# Patient Record
Sex: Female | Born: 1990 | Race: Black or African American | Hispanic: No | Marital: Single | Smoking: Never smoker
Health system: Southern US, Community
[De-identification: ages and names within clinical notes are randomized; demographics above are authoritative.]

## PROBLEM LIST (undated history)

## (undated) ENCOUNTER — Inpatient Hospital Stay (HOSPITAL_COMMUNITY): Payer: Self-pay

## (undated) DIAGNOSIS — E559 Vitamin D deficiency, unspecified: Secondary | ICD-10-CM

## (undated) DIAGNOSIS — K5792 Diverticulitis of intestine, part unspecified, without perforation or abscess without bleeding: Secondary | ICD-10-CM

## (undated) DIAGNOSIS — D509 Iron deficiency anemia, unspecified: Secondary | ICD-10-CM

---

## 2014-08-25 DIAGNOSIS — K5792 Diverticulitis of intestine, part unspecified, without perforation or abscess without bleeding: Secondary | ICD-10-CM

## 2014-08-25 HISTORY — DX: Diverticulitis of intestine, part unspecified, without perforation or abscess without bleeding: K57.92

## 2015-10-08 DIAGNOSIS — D509 Iron deficiency anemia, unspecified: Secondary | ICD-10-CM | POA: Insufficient documentation

## 2016-08-25 HISTORY — PX: DILATION AND CURETTAGE OF UTERUS: SHX78

## 2016-12-04 ENCOUNTER — Encounter: Payer: Self-pay | Admitting: Family Medicine

## 2016-12-18 ENCOUNTER — Inpatient Hospital Stay (HOSPITAL_COMMUNITY): Payer: Medicaid Other

## 2016-12-18 ENCOUNTER — Encounter (HOSPITAL_COMMUNITY): Payer: Self-pay | Admitting: *Deleted

## 2016-12-18 ENCOUNTER — Inpatient Hospital Stay (HOSPITAL_COMMUNITY)
Admission: AD | Admit: 2016-12-18 | Discharge: 2016-12-18 | Disposition: A | Discharge status: Home / Self Care | Payer: Medicaid Other | Source: Ambulatory Visit | Attending: Obstetrics & Gynecology | Admitting: Obstetrics & Gynecology

## 2016-12-18 DIAGNOSIS — O021 Missed abortion: Secondary | ICD-10-CM

## 2016-12-18 DIAGNOSIS — IMO0002 Reserved for concepts with insufficient information to code with codable children: Secondary | ICD-10-CM

## 2016-12-18 LAB — URINALYSIS, ROUTINE W REFLEX MICROSCOPIC
Bilirubin Urine: NEGATIVE
GLUCOSE, UA: NEGATIVE mg/dL
Hgb urine dipstick: NEGATIVE
Ketones, ur: NEGATIVE mg/dL
LEUKOCYTES UA: NEGATIVE
NITRITE: NEGATIVE
PH: 7 (ref 5.0–8.0)
PROTEIN: NEGATIVE mg/dL
Specific Gravity, Urine: 1.011 (ref 1.005–1.030)

## 2016-12-18 LAB — POCT PREGNANCY, URINE: Preg Test, Ur: POSITIVE — AB

## 2016-12-18 NOTE — Discharge Instructions (Signed)

## 2016-12-18 NOTE — MAU Note (Signed)
Pt was at [facility]. Told she was measuring 9 weeks(supposed to be 12 weeks) and possible having a miscarriage. Pt has no symptoms wants a second opinion. Denies pain, bleeding or discharge.

## 2016-12-18 NOTE — MAU Provider Note (Signed)
  History     CSN: [id number]  Arrival date and time: 12/18/16 1516   None     No chief complaint on file.  Patient is a 26 showed G1 P0 at 11 weeks based on a 6 week 6 day ultrasound performed in the [facility] emergency Department. She presents today for what appears to be a second opinion on if she is having a miscarriage or not. She was seen by her OB today at [facility] OB ultrasound was performed for nuchal translucency and they saw a baby that was lagging in growth with no heartbeat. She after having ultrasound here did reveal to me that she had Artie been scheduled for [facility] on Monday. She has had no abdominal pain or bleeding.    OB History    Gravida Para Term Preterm AB Living   1             SAB TAB Ectopic Multiple Live Births                  No past medical history on file.  No past surgical history on file.  No family history on file.  Social History  Substance Use Topics  . Smoking status: Not on file  . Smokeless tobacco: Not on file  . Alcohol use Not on file    Allergies: Allergies not on file  No prescriptions prior to admission.    Review of Systems  Constitutional: Negative for chills and fever.  HENT: Negative for congestion and rhinorrhea.   Respiratory: Negative for cough and shortness of breath.   Gastrointestinal: Negative for abdominal distention, abdominal pain, constipation, diarrhea, nausea and vomiting.  Genitourinary: Negative for difficulty urinating, dysuria and frequency.  Neurological: Negative for dizziness and weakness.   Physical Exam   Blood pressure 131/65, pulse 92, temperature 98.6 F (37 C), resp. rate 18, height  (1.626 m), weight 183 lb (83 kg), last menstrual period 09/25/2016.  Physical Exam  Constitutional: She is oriented to person, place, and time. She appears well-developed and well-nourished.  HENT:  Head: Normocephalic and atraumatic.  Cardiovascular: Normal rate and intact distal pulses.    Respiratory: Effort normal. No respiratory distress.  GI: Soft. She exhibits no distension. There is no tenderness. There is no rebound and no guarding.  Musculoskeletal: Normal range of motion. She exhibits no edema.  Neurological: She is alert and oriented to person, place, and time. No cranial nerve deficit.  Skin: Skin is warm and dry.  Psychiatric: She has a normal mood and affect. Her behavior is normal.    MAU Course  Procedures  MDM In MA U patient underwent evaluation with ultrasound. Ultrasound revealed a 9 week 0 day intrauterine pregnancy with no fetal heart tones. This meets definitive criteria for missed AB and should be managed as such.   Patient referred back to her primary OB for further care.  Assessment and Plan  #1: Missed AB: Patient is 9 weeks 0 days' could be offered Cytotec protocol versus surgical management. Given that we are not patient's primary OB she was referred back to her primary OB who had Artie scheduled surgical management. She is to explore nonsurgical management further and will call her obstetrician and asked about this.  Ernestina Penna 12/18/2016, 5:45 PM

## 2016-12-26 DIAGNOSIS — O034 Incomplete spontaneous abortion without complication: Secondary | ICD-10-CM | POA: Insufficient documentation

## 2017-07-23 DIAGNOSIS — K5792 Diverticulitis of intestine, part unspecified, without perforation or abscess without bleeding: Secondary | ICD-10-CM | POA: Insufficient documentation

## 2017-08-13 DIAGNOSIS — Z862 Personal history of diseases of the blood and blood-forming organs and certain disorders involving the immune mechanism: Secondary | ICD-10-CM | POA: Insufficient documentation

## 2017-08-25 NOTE — L&D Delivery Note (Signed)
Delivery Note At  a viable female was delivered via  (Presentation:vertex ;  LOA).  APGAR:8 , ; weight  .   Placenta status:spont ,shultz .  Cord:3vc  with the following complications:none .  Cord pH: n/a  Anesthesia:  epidural Episiotomy:  none Lacerations:   Suture Repair: 2.0 vicryl rapide Est. Blood Loss (mL):    Mom to postpartum.  Baby to Couplet care / Skin to Skin.  Wyvonnia DuskyMarie Corina Stacy 03/14/2018, 9:24 AM

## 2017-09-01 ENCOUNTER — Ambulatory Visit (INDEPENDENT_AMBULATORY_CARE_PROVIDER_SITE_OTHER): Payer: Medicaid Other | Admitting: Certified Nurse Midwife

## 2017-09-01 ENCOUNTER — Other Ambulatory Visit (HOSPITAL_COMMUNITY)
Admission: RE | Admit: 2017-09-01 | Discharge: 2017-09-01 | Disposition: A | Discharge status: Home / Self Care | Payer: Medicaid Other | Source: Ambulatory Visit | Attending: Certified Nurse Midwife | Admitting: Certified Nurse Midwife

## 2017-09-01 ENCOUNTER — Encounter: Payer: Self-pay | Admitting: Certified Nurse Midwife

## 2017-09-01 VITALS — Wt 183.4 lb

## 2017-09-01 DIAGNOSIS — Z3480 Encounter for supervision of other normal pregnancy, unspecified trimester: Secondary | ICD-10-CM

## 2017-09-01 DIAGNOSIS — Z3481 Encounter for supervision of other normal pregnancy, first trimester: Secondary | ICD-10-CM | POA: Insufficient documentation

## 2017-09-01 NOTE — Progress Notes (Signed)
Subjective:   Pamela Brandt is a 27 y.o. G2P0010 at [redacted]w[redacted]d by LMP, early ultrasound performed at 9 weeks in [place] being seen today for her first obstetrical visit.  Her obstetrical history is significant for hx of missed ab in April 2018. Patient does intend to breast feed. Pregnancy history fully reviewed.  Patient reports no complaints.  HISTORY: Obstetric History   G2   P0   T0   P0   A1   L0    SAB1   TAB0   Ectopic0   Multiple0   Live Births0     # Outcome Date GA Lbr Len/2nd Weight Sex Delivery Anes PTL Lv  2 Current           1 SAB 11/2016             History reviewed. No pertinent past medical history. Past Surgical History:  Procedure Laterality Date  . DILATION AND CURETTAGE OF UTERUS  2018   History reviewed. No pertinent family history. Social History   Tobacco Use  . Smoking status: Never Smoker  . Smokeless tobacco: Never Used  Substance Use Topics  . Alcohol use: No    Frequency: Never  . Drug use: No   Allergies  Allergen Reactions  . Latex Rash and Dermatitis   Current Outpatient Medications on File Prior to Visit  Medication Sig Dispense Refill  . Prenat w/o A-FeCbGl-DSS-FA-DHA (CITRANATAL 90 DHA) 90-1 & 300 MG MISC 1 of each tablet daily    . Prenatal Vit-Fe Fumarate-FA (PRENATAL MULTIVITAMIN) TABS tablet Take 1 tablet by mouth daily at 12 noon.     No current facility-administered medications on file prior to visit.      Exam   Vitals:   09/01/17 1017  Weight: 183 lb 6.4 oz (83.2 kg)   Fetal Heart Rate (bpm): 180; doppler  Uterus:     Pelvic Exam: Perineum: no hemorrhoids, normal perineum   Vulva: normal external genitalia, no lesions   Vagina:  normal mucosa, normal discharge   Cervix: no lesions and normal, pap smear done.    Adnexa: normal adnexa and no mass, fullness, tenderness   Bony Pelvis: average  System: General: well-developed, well-nourished female in no acute distress   Breast:  normal appearance, no masses or  tenderness   Skin: normal coloration and turgor, no rashes   Neurologic: oriented, normal, negative, normal mood   Extremities: normal strength, tone, and muscle mass, ROM of all joints is normal   HEENT PERRLA, extraocular movement intact and sclera clear, anicteric   Mouth/Teeth mucous membranes moist, pharynx normal without lesions and dental hygiene good   Neck supple and no masses   Cardiovascular: regular rate and rhythm   Respiratory:  no respiratory distress, normal breath sounds   Abdomen: soft, non-tender; bowel sounds normal; no masses,  no organomegaly     Assessment:   Pregnancy: G2P0010 Patient Active Problem List   Diagnosis Date Noted  . Supervision of other normal pregnancy, antepartum 09/01/2017  . Hx of iron deficiency anemia 08/13/2017  . Diverticulitis 07/23/2017  . Incomplete abortion 12/26/2016  . Microcytic anemia 10/08/2015     Plan:  1. Supervision of other normal pregnancy, antepartum      - Inheritest Core(CF97,SMA,FraX) - MaterniT21 PLUS Core+ESS+SCA - Culture, OB Urine - Obstetric Panel, Including HIV - Hemoglobinopathy evaluation - Cervicovaginal ancillary only - Cytology - PAP - Hemoglobin A1c - [place] MFM OB COMP + 14 WK; Future - VITAMIN D 25  Hydroxy (Vit-D Deficiency, Fractures)   Initial labs drawn. Continue prenatal vitamins. Genetic Screening discussed, NIPS: ordered. Ultrasound discussed; fetal anatomic survey: ordered. Problem list reviewed and updated. The nature of [facility] with multiple MDs and other [facility] Providers was explained to patient; also emphasized that residents, students are part of our team. Routine obstetric precautions reviewed. Return in about 4 weeks (around 09/29/2017) for ROB.     Orvilla Cornwallachelle Falan Hensler, CNM [facility]

## 2017-09-01 NOTE — Patient Instructions (Addendum)
Second Trimester of Pregnancy The second trimester is from week 13 through week 28, month 4 through 6. This is often the time in pregnancy that you feel your best. Often times, morning sickness has lessened or quit. You may have more energy, and you may get hungry more often. Your unborn baby (fetus) is growing rapidly. At the end of the sixth month, he or she is about 9 inches long and weighs about 1 pounds. You will likely feel the baby move (quickening) between 18 and 20 weeks of pregnancy. Follow these instructions at home:  Avoid all smoking, herbs, and alcohol. Avoid drugs not approved by your doctor.  Do not use any tobacco products, including cigarettes, chewing tobacco, and electronic cigarettes. If you need help quitting, ask your doctor. You may get counseling or other support to help you quit.  Only take medicine as told by your doctor. Some medicines are safe and some are not during pregnancy.  Exercise only as told by your doctor. Stop exercising if you start having cramps.  Eat regular, healthy meals.  Wear a good support bra if your breasts are tender.  Do not use hot tubs, steam rooms, or saunas.  Wear your seat belt when driving.  Avoid raw meat, uncooked cheese, and liter boxes and soil used by cats.  Take your prenatal vitamins.  Take 1500-2000 milligrams of calcium daily starting at the 20th week of pregnancy until you deliver your baby.  Try taking medicine that helps you poop (stool softener) as needed, and if your doctor approves. Eat more fiber by eating fresh fruit, vegetables, and whole grains. Drink enough fluids to keep your pee (urine) clear or pale yellow.  Take warm water baths (sitz baths) to soothe pain or discomfort caused by hemorrhoids. Use hemorrhoid cream if your doctor approves.  If you have puffy, bulging veins (varicose veins), wear support hose. Raise (elevate) your feet for 15 minutes, 3-4 times a day. Limit salt in your diet.  Avoid heavy  lifting, wear low heals, and sit up straight.  Rest with your legs raised if you have leg cramps or low back pain.  Visit your dentist if you have not gone during your pregnancy. Use a soft toothbrush to brush your teeth. Be gentle when you floss.  You can have sex (intercourse) unless your doctor tells you not to.  Go to your doctor visits. Get help if:  You feel dizzy.  You have mild cramps or pressure in your lower belly (abdomen).  You have a nagging pain in your belly area.  You continue to feel sick to your stomach (nauseous), throw up (vomit), or have watery poop (diarrhea).  You have bad smelling fluid coming from your vagina.  You have pain with peeing (urination). Get help right away if:  You have a fever.  You are leaking fluid from your vagina.  You have spotting or bleeding from your vagina.  You have severe belly cramping or pain.  You lose or gain weight rapidly.  You have trouble catching your breath and have chest pain.  You notice sudden or extreme puffiness (swelling) of your face, hands, ankles, feet, or legs.  You have not felt the baby move in over an hour.  You have severe headaches that do not go away with medicine.  You have vision changes. This information is not intended to replace advice given to you by your health care provider. Make sure you discuss any questions you have with your health care   provider. Document Released: 11/05/2009 Document Revised: 01/17/2016 Document Reviewed: 10/12/2012 Elsevier Interactive Patient Education  2017 [employer]  Prenatal Care WHAT IS PRENATAL CARE? Prenatal care is the process of caring for a pregnant woman before she gives birth. Prenatal care makes sure that she and her baby remain as healthy as possible throughout pregnancy. Prenatal care may be provided by a midwife, family practice health care provider, or a childbirth and pregnancy specialist (obstetrician). Prenatal care may include physical  examinations, testing, treatments, and education on nutrition, lifestyle, and social support services. WHY IS PRENATAL CARE SO IMPORTANT? Early and consistent prenatal care increases the chance that you and your baby will remain healthy throughout your pregnancy. This type of care also decreases a baby's risk of being born too early (prematurely), or being born smaller than expected (small for gestational age). Any underlying medical conditions you may have that could pose a risk during your pregnancy are discussed during prenatal care visits. You will also be monitored regularly for any new conditions that may arise during your pregnancy so they can be treated quickly and effectively. WHAT HAPPENS DURING PRENATAL CARE VISITS? Prenatal care visits may include the following: Discussion Tell your health care provider about any new signs or symptoms you have experienced since your last visit. These might include:  Nausea or vomiting.  Increased or decreased level of energy.  Difficulty sleeping.  Back or leg pain.  Weight changes.  Frequent urination.  Shortness of breath with physical activity.  Changes in your skin, such as the development of a rash or itchiness.  Vaginal discharge or bleeding.  Feelings of excitement or nervousness.  Changes in your baby's movements.  You may want to write down any questions or topics you want to discuss with your health care provider and bring them with you to your appointment. Examination During your first prenatal care visit, you will likely have a complete physical exam. Your health care provider will often examine your vagina, cervix, and the position of your uterus, as well as check your heart, lungs, and other body systems. As your pregnancy progresses, your health care provider will measure the size of your uterus and your baby's position inside your uterus. He or she may also examine you for early signs of labor. Your prenatal visits may also  include checking your blood pressure and, after about 10-12 weeks of pregnancy, listening to your baby's heartbeat. Testing Regular testing often includes:  Urinalysis. This checks your urine for glucose, protein, or signs of infection.  Blood count. This checks the levels of white and red blood cells in your body.  Tests for sexually transmitted infections (STIs). Testing for STIs at the beginning of pregnancy is routinely done and is required in many states.  Antibody testing. You will be checked to see if you are immune to certain illnesses, such as rubella, that can affect a developing fetus.  Glucose screen. Around 24-28 weeks of pregnancy, your blood glucose level will be checked for signs of gestational diabetes. Follow-up tests may be recommended.  Group B strep. This is a bacteria that is commonly found inside a woman's vagina. This test will inform your health care provider if you need an antibiotic to reduce the amount of this bacteria in your body prior to labor and childbirth.  Ultrasound. Many pregnant women undergo an ultrasound screening around 18-20 weeks of pregnancy to evaluate the health of the fetus and check for any developmental abnormalities.  HIV (human immunodeficiency virus) testing. Early  in your pregnancy, you will be screened for HIV. If you are at high risk for HIV, this test may be repeated during your third trimester of pregnancy.  You may be offered other testing based on your age, personal or family medical history, or other factors. HOW OFTEN SHOULD I PLAN TO SEE MY HEALTH CARE PROVIDER FOR PRENATAL CARE? Your prenatal care check-up schedule depends on any medical conditions you have before, or develop during, your pregnancy. If you do not have any underlying medical conditions, you will likely be seen for checkups:  Monthly, during the first 6 months of pregnancy.  Twice a month during months 7 and 8 of pregnancy.  Weekly starting in the 9th month of  pregnancy and until delivery.  If you develop signs of early labor or other concerning signs or symptoms, you may need to see your health care provider more often. Ask your health care provider what prenatal care schedule is best for you. WHAT CAN I DO TO KEEP MYSELF AND MY BABY AS HEALTHY AS POSSIBLE DURING MY PREGNANCY?  Take a prenatal vitamin containing 400 micrograms (0.4 mg) of folic acid every day. Your health care provider may also ask you to take additional vitamins such as iodine, vitamin D, iron, copper, and zinc.  Take 1500-2000 mg of calcium daily starting at your 20th week of pregnancy until you deliver your baby.  Make sure you are up to date on your vaccinations. Unless directed otherwise by your health care provider: ? You should receive a tetanus, diphtheria, and pertussis (Tdap) vaccination between the 27th and 36th week of your pregnancy, regardless of when your last Tdap immunization occurred. This helps protect your baby from whooping cough (pertussis) after he or she is born. ? You should receive an annual inactivated influenza vaccine (IIV) to help protect you and your baby from influenza. This can be done at any point during your pregnancy.  Eat a well-rounded diet that includes: ? Fresh fruits and vegetables. ? Lean proteins. ? Calcium-rich foods such as milk, yogurt, hard cheeses, and dark, leafy greens. ? Whole grain breads.  Do noteat seafood high in mercury, including: ? Swordfish. ? Tilefish. ? Shark. ? King mackerel. ? More than 6 oz tuna per week.  Do not eat: ? Raw or undercooked meats or eggs. ? Unpasteurized foods, such as soft cheeses (brie, blue, or feta), juices, and milks. ? Lunch meats. ? Hot dogs that have not been heated until they are steaming.  Drink enough water to keep your urine clear or pale yellow. For many women, this may be 10 or more 8 oz glasses of water each day. Keeping yourself hydrated helps deliver nutrients to your baby and  may prevent the start of pre-term uterine contractions.  Do not use any tobacco products including cigarettes, chewing tobacco, or electronic cigarettes. If you need help quitting, ask your health care provider.  Do not drink beverages containing alcohol. No safe level of alcohol consumption during pregnancy has been determined.  Do not use any illegal drugs. These can harm your developing baby or cause a miscarriage.  Ask your health care provider or pharmacist before taking any prescription or over-the-counter medicines, herbs, or supplements.  Limit your caffeine intake to no more than 200 mg per day.  Exercise. Unless told otherwise by your health care provider, try to get 30 minutes of moderate exercise most days of the week. Do not  do high-impact activities, contact sports, or activities with a high risk  of falling, such as horseback riding or downhill skiing.  Get plenty of rest.  Avoid anything that raises your body temperature, such as hot tubs and saunas.  If you own a cat, do not empty its litter box. Bacteria contained in cat feces can cause an infection called toxoplasmosis. This can result in serious harm to the fetus.  Stay away from chemicals such as insecticides, lead, mercury, and cleaning or paint products that contain solvents.  Do not have any X-rays taken unless medically necessary.  Take a childbirth and breastfeeding preparation class. Ask your health care provider if you need a referral or recommendation.  This information is not intended to replace advice given to you by your health care provider. Make sure you discuss any questions you have with your health care provider. Document Released: 08/14/2003 Document Revised: 01/14/2016 Document Reviewed: 10/26/2013 Elsevier Interactive Patient Education  2017 [employer].  How a Baby Grows During Pregnancy Pregnancy begins when a female's sperm enters a female's egg (fertilization). This happens in one of the  tubes (fallopian tubes) that connect the ovaries to the womb (uterus). The fertilized egg is called an embryo until it reaches 10 weeks. From 10 weeks until birth, it is called a fetus. The fertilized egg moves down the fallopian tube to the uterus. Then it implants into the lining of the uterus and begins to grow. The developing fetus receives oxygen and nutrients through the pregnant woman's bloodstream and the tissues that grow (placenta) to support the fetus. The placenta is the life support system for the fetus. It provides nutrition and removes waste. Learning as much as you can about your pregnancy and how your baby is developing can help you enjoy the experience. It can also make you aware of when there might be a problem and when to ask questions. How long does a typical pregnancy last? A pregnancy usually lasts 280 days, or about 40 weeks. Pregnancy is divided into three trimesters:  First trimester: 0-13 weeks.  Second trimester: 14-27 weeks.  Third trimester: 28-40 weeks.  The day when your baby is considered ready to be born (full term) is your estimated date of delivery. How does my baby develop month by month? First month  The fertilized egg attaches to the inside of the uterus.  Some cells will form the placenta. Others will form the fetus.  The arms, legs, brain, spinal cord, lungs, and heart begin to develop.  At the end of the first month, the heart begins to beat.  Second month  The bones, inner ear, eyelids, hands, and feet form.  The genitals develop.  By the end of 8 weeks, all major organs are developing.  Third month  All of the internal organs are forming.  Teeth develop below the gums.  Bones and muscles begin to grow. The spine can flex.  The skin is transparent.  Fingernails and toenails begin to form.  Arms and legs continue to grow longer, and hands and feet develop.  The fetus is about 3 in (7.6 cm) long.  Fourth month  The placenta is  completely formed.  The external sex organs, neck, outer ear, eyebrows, eyelids, and fingernails are formed.  The fetus can hear, swallow, and move its arms and legs.  The kidneys begin to produce urine.  The skin is covered with a white waxy coating (vernix) and very fine hair (lanugo).  Fifth month  The fetus moves around more and can be felt for the first time (quickening).  The fetus starts to sleep and wake up and may begin to suck its finger.  The nails grow to the end of the fingers.  The organ in the digestive system that makes bile (gallbladder) functions and helps to digest the nutrients.  If your baby is a girl, eggs are present in her ovaries. If your baby is a boy, testicles start to move down into his scrotum.  Sixth month  The lungs are formed, but the fetus is not yet able to breathe.  The eyes open. The brain continues to develop.  Your baby has fingerprints and toe prints. Your baby's hair grows thicker.  At the end of the second trimester, the fetus is about 9 in (22.9 cm) long.  Seventh month  The fetus kicks and stretches.  The eyes are developed enough to sense changes in light.  The hands can make a grasping motion.  The fetus responds to sound.  Eighth month  All organs and body systems are fully developed and functioning.  Bones harden and taste buds develop. The fetus may hiccup.  Certain areas of the brain are still developing. The skull remains soft.  Ninth month  The fetus gains about  lb (0.23 kg) each week.  The lungs are fully developed.  Patterns of sleep develop.  The fetus's head typically moves into a head-down position (vertex) in the uterus to prepare for birth. If the buttocks move into a vertex position instead, the baby is breech.  The fetus weighs 6-9 lbs (2.72-4.08 kg) and is 19-20 in (48.26-50.8 cm) long.  What can I do to have a healthy pregnancy and help my baby develop? Eating and Drinking  Eat a healthy  diet. ? Talk with your health care provider to make sure that you are getting the nutrients that you and your baby need. ? Visit www.[employer].be to learn about creating a healthy diet.  Gain a healthy amount of weight during pregnancy as advised by your health care provider. This is usually 25-35 pounds. You may need to: ? Gain more if you were underweight before getting pregnant or if you are pregnant with more than one baby. ? Gain less if you were overweight or obese when you got pregnant.  Medicines and Vitamins  Take prenatal vitamins as directed by your health care provider. These include vitamins such as folic acid, iron, calcium, and vitamin D. They are important for healthy development.  Take medicines only as directed by your health care provider. Read labels and ask a pharmacist or your health care provider whether over-the-counter medicines, supplements, and prescription drugs are safe to take during pregnancy.  Activities  Be physically active as advised by your health care provider. Ask your health care provider to recommend activities that are safe for you to do, such as walking or swimming.  Do not participate in strenuous or extreme sports.  Lifestyle  Do not drink alcohol.  Do not use any tobacco products, including cigarettes, chewing tobacco, or electronic cigarettes. If you need help quitting, ask your health care provider.  Do not use illegal drugs.  Safety  Avoid exposure to mercury, lead, or other heavy metals. Ask your health care provider about common sources of these heavy metals.  Avoid listeria infection during pregnancy. Follow these precautions: ? Do not eat soft cheeses or deli meats. ? Do not eat hot dogs unless they have been warmed up to the point of steaming, such as in the microwave oven. ? Do not drink unpasteurized  milk.  Avoid toxoplasmosis infection during pregnancy. Follow these precautions: ? Do not change your cat's litter box,  if you have a cat. Ask someone else to do this for you. ? Wear gardening gloves while working in the yard.  General Instructions  Keep all follow-up visits as directed by your health care provider. This is important. This includes prenatal care and screening tests.  Manage any chronic health conditions. Work closely with your health care provider to keep conditions, such as diabetes, under control.  How do I know if my baby is developing well? At each prenatal visit, your health care provider will do several different tests to check on your health and keep track of your baby's development. These include:  Fundal height. ? Your health care provider will measure your growing belly from top to bottom using a tape measure. ? Your health care provider will also feel your belly to determine your baby's position.  Heartbeat. ? An ultrasound in the first trimester can confirm pregnancy and show a heartbeat, depending on how far along you are. ? Your health care provider will check your baby's heart rate at every prenatal visit. ? As you get closer to your delivery date, you may have regular fetal heart rate monitoring to make sure that your baby is not in distress.  Second trimester ultrasound. ? This ultrasound checks your baby's development. It also indicates your baby's gender.  What should I do if I have concerns about my baby's development? Always talk with your health care provider about any concerns that you may have. This information is not intended to replace advice given to you by your health care provider. Make sure you discuss any questions you have with your health care provider. Document Released: 01/28/2008 Document Revised: 01/17/2016 Document Reviewed: 01/18/2014 Elsevier Interactive Patient Education  [employer].

## 2017-09-01 NOTE — Progress Notes (Signed)
Patient is in the office for initial ob visit. This is a planned pregnancy.

## 2017-09-01 NOTE — Addendum Note (Signed)
Addended by: Natale MilchSTALLING, Alaia Lordi D on: 09/01/2017 03:30 PM   Modules accepted: Orders

## 2017-09-02 LAB — CERVICOVAGINAL ANCILLARY ONLY
BACTERIAL VAGINITIS: POSITIVE — AB
CANDIDA VAGINITIS: NEGATIVE
CHLAMYDIA, DNA PROBE: NEGATIVE
Neisseria Gonorrhea: NEGATIVE
TRICH (WINDOWPATH): NEGATIVE

## 2017-09-03 ENCOUNTER — Other Ambulatory Visit: Payer: Self-pay | Admitting: Certified Nurse Midwife

## 2017-09-03 ENCOUNTER — Encounter: Payer: Self-pay | Admitting: Certified Nurse Midwife

## 2017-09-03 DIAGNOSIS — B9689 Other specified bacterial agents as the cause of diseases classified elsewhere: Secondary | ICD-10-CM

## 2017-09-03 DIAGNOSIS — N760 Acute vaginitis: Principal | ICD-10-CM

## 2017-09-03 LAB — OBSTETRIC PANEL, INCLUDING HIV
Antibody Screen: NEGATIVE
BASOS ABS: 0 10*3/uL (ref 0.0–0.2)
Basos: 0 %
EOS (ABSOLUTE): 0.1 10*3/uL (ref 0.0–0.4)
Eos: 2 %
HEP B S AG: NEGATIVE
HIV SCREEN 4TH GENERATION: NONREACTIVE
Hematocrit: 35 % (ref 34.0–46.6)
Hemoglobin: 11.5 g/dL (ref 11.1–15.9)
IMMATURE GRANULOCYTES: 0 %
Immature Grans (Abs): 0 10*3/uL (ref 0.0–0.1)
LYMPHS ABS: 1.3 10*3/uL (ref 0.7–3.1)
Lymphs: 26 %
MCH: 25.7 pg — AB (ref 26.6–33.0)
MCHC: 32.9 g/dL (ref 31.5–35.7)
MCV: 78 fL — AB (ref 79–97)
MONOS ABS: 0.4 10*3/uL (ref 0.1–0.9)
Monocytes: 9 %
NEUTROS ABS: 3.2 10*3/uL (ref 1.4–7.0)
NEUTROS PCT: 63 %
PLATELETS: 280 10*3/uL (ref 150–379)
RBC: 4.48 x10E6/uL (ref 3.77–5.28)
RDW: 15.2 % (ref 12.3–15.4)
RPR Ser Ql: NONREACTIVE
Rh Factor: POSITIVE
Rubella Antibodies, IGG: 1.35 index (ref >0.99)
WBC: 5 10*3/uL (ref 3.4–10.8)

## 2017-09-03 LAB — CYTOLOGY - PAP
Adequacy: ABSENT
DIAGNOSIS: NEGATIVE

## 2017-09-03 LAB — HEMOGLOBINOPATHY EVALUATION
HEMOGLOBIN F QUANTITATION: 0 % (ref 0.0–2.0)
HGB A: 97.7 % (ref 96.4–98.8)
HGB C: 0 %
HGB S: 0 %
HGB VARIANT: 0 %
Hemoglobin A2 Quantitation: 2.3 % (ref 1.8–3.2)

## 2017-09-03 LAB — HEMOGLOBIN A1C
ESTIMATED AVERAGE GLUCOSE: 103 mg/dL
HEMOGLOBIN A1C: 5.2 % (ref 4.8–5.6)

## 2017-09-03 LAB — VITAMIN D 25 HYDROXY (VIT D DEFICIENCY, FRACTURES): Vit D, 25-Hydroxy: 19.6 ng/mL — ABNORMAL LOW (ref 30.0–100.0)

## 2017-09-03 MED ORDER — METRONIDAZOLE 0.75 % VA GEL: 1.0000 | Freq: Two times a day (BID) | VAGINAL | 0 refills | Status: DC

## 2017-09-04 LAB — URINE CULTURE, OB REFLEX

## 2017-09-04 LAB — CULTURE, OB URINE

## 2017-09-06 LAB — MATERNIT21  PLUS CORE+ESS+SCA, BLOOD
Chromosome 13: NEGATIVE
Chromosome 18: NEGATIVE
Chromosome 21: NEGATIVE
Y CHROMOSOME: DETECTED

## 2017-09-07 ENCOUNTER — Other Ambulatory Visit: Payer: Self-pay | Admitting: Certified Nurse Midwife

## 2017-09-07 ENCOUNTER — Telehealth: Payer: Self-pay

## 2017-09-07 DIAGNOSIS — Z3480 Encounter for supervision of other normal pregnancy, unspecified trimester: Secondary | ICD-10-CM

## 2017-09-07 DIAGNOSIS — E559 Vitamin D deficiency, unspecified: Secondary | ICD-10-CM

## 2017-09-07 MED ORDER — CITRANATAL 90 DHA 90-1 & 300 MG PO MISC: 300.0000 mg | Freq: Two times a day (BID) | ORAL | 9 refills | Status: AC

## 2017-09-07 MED ORDER — VITAMIN D (ERGOCALCIFEROL) 1.25 MG (50000 UNIT) PO CAPS: 50000.0000 [IU] | ORAL_CAPSULE | ORAL | 2 refills | Status: AC

## 2017-09-07 NOTE — Telephone Encounter (Signed)
-----   Message from Roe Coombsachelle A Denney, CNM sent at 09/07/2017  1:11 PM EST ----- Please let her know that her vitamin D level is low.  Vitamin D weekly tablet has been sent to her pharmacy for her to take.  Thank you.  R.Denney CNM

## 2017-09-07 NOTE — Telephone Encounter (Signed)
Patient notified

## 2017-09-09 ENCOUNTER — Encounter (HOSPITAL_COMMUNITY): Payer: Self-pay | Admitting: Certified Nurse Midwife

## 2017-09-11 LAB — INHERITEST CORE(CF97,SMA,FRAX)

## 2017-09-15 ENCOUNTER — Other Ambulatory Visit: Payer: Self-pay | Admitting: Certified Nurse Midwife

## 2017-09-15 DIAGNOSIS — Z3480 Encounter for supervision of other normal pregnancy, unspecified trimester: Secondary | ICD-10-CM

## 2017-09-16 ENCOUNTER — Ambulatory Visit (HOSPITAL_COMMUNITY)
Admission: RE | Admit: 2017-09-16 | Discharge: 2017-09-16 | Disposition: A | Discharge status: Home / Self Care | Payer: Medicaid Other | Source: Ambulatory Visit | Attending: Certified Nurse Midwife | Admitting: Certified Nurse Midwife

## 2017-09-16 DIAGNOSIS — Z3480 Encounter for supervision of other normal pregnancy, unspecified trimester: Secondary | ICD-10-CM

## 2017-09-17 ENCOUNTER — Telehealth: Payer: Self-pay | Admitting: *Deleted

## 2017-09-17 NOTE — Telephone Encounter (Signed)
Pt called to office stating she has had increase in HA this week. Pt states HA's have been daily but may come and go. Pt has not yet taken any OTC meds as she did not know what to take.  Pt advised to that she may take 2 extra strength Tylenol every 6 hours and drink plenty of fluid. Pt made aware that she may try this and make office aware if Tylenol doesn't resolve HA.  Pt made aware message to be sent to provider for further recommendations / Rx.   Please advise if Rx is sent.

## 2017-09-18 ENCOUNTER — Other Ambulatory Visit: Payer: Self-pay | Admitting: Certified Nurse Midwife

## 2017-09-18 NOTE — Telephone Encounter (Signed)
She may need a blood pressure check before her next ROB.  She has an appointment 09/29/17.  If HA unresolved with Tylenol, please schedule nurse BP check next week.  Thank you.

## 2017-09-21 NOTE — Telephone Encounter (Signed)
I spoke with patient. She states HA better.  I advised to contact office should it occur again and to keep ROB.  She voiced understanding and agreed with plan.

## 2017-09-29 ENCOUNTER — Ambulatory Visit (INDEPENDENT_AMBULATORY_CARE_PROVIDER_SITE_OTHER): Payer: Medicaid Other | Admitting: Certified Nurse Midwife

## 2017-09-29 ENCOUNTER — Encounter: Payer: Self-pay | Admitting: Certified Nurse Midwife

## 2017-09-29 VITALS — BP 137/96 | HR 108 | Wt 190.2 lb

## 2017-09-29 DIAGNOSIS — E559 Vitamin D deficiency, unspecified: Secondary | ICD-10-CM

## 2017-09-29 DIAGNOSIS — O162 Unspecified maternal hypertension, second trimester: Secondary | ICD-10-CM

## 2017-09-29 DIAGNOSIS — Z3482 Encounter for supervision of other normal pregnancy, second trimester: Secondary | ICD-10-CM

## 2017-09-29 DIAGNOSIS — Z862 Personal history of diseases of the blood and blood-forming organs and certain disorders involving the immune mechanism: Secondary | ICD-10-CM

## 2017-09-29 DIAGNOSIS — Z3480 Encounter for supervision of other normal pregnancy, unspecified trimester: Secondary | ICD-10-CM

## 2017-09-29 NOTE — Progress Notes (Signed)
Pt denies concerns at this time. 

## 2017-09-29 NOTE — Progress Notes (Signed)
   PRENATAL VISIT NOTE  Subjective:  Pamela Brandt is a 27 y.o. G2P0010 at 8622w4d being seen today for ongoing prenatal care.  She is currently monitored for the following issues for this low-risk pregnancy and has Diverticulitis; Hx of iron deficiency anemia; Incomplete abortion; Microcytic anemia; Supervision of other normal pregnancy, antepartum; Vitamin D deficiency; and Elevated blood pressure affecting pregnancy in second trimester, antepartum on their problem list.  Patient reports no complaints.  Contractions: Not present. Vag. Bleeding: None.   . Denies leaking of fluid.   The following portions of the patient's history were reviewed and updated as appropriate: allergies, current medications, past family history, past medical history, past social history, past surgical history and problem list. Problem list updated.  Objective:   Vitals:   09/29/17 0842  BP: (!) 137/96  Pulse: (!) 108  Weight: 190 lb 3.2 oz (86.3 kg)    Fetal Status: Fetal Heart Rate (bpm): 164; doppler         General:  Alert, oriented and cooperative. Patient is in no acute distress.  Skin: Skin is warm and dry. No rash noted.   Cardiovascular: Normal heart rate noted  Respiratory: Normal respiratory effort, no problems with respiration noted  Abdomen: Soft, gravid, appropriate for gestational age.  Pain/Pressure: Absent     Pelvic: Cervical exam deferred        Extremities: Normal range of motion.  Edema: None  Mental Status:  Normal mood and affect. Normal behavior. Normal judgment and thought content.   Assessment and Plan:  Pregnancy: G2P0010 at 4922w4d  1. Supervision of other normal pregnancy, antepartum      Doing well - AFP, Serum, Open Spina Bifida  2. Hx of iron deficiency anemia     Taking Citranatal  3. Vitamin D deficiency     Taking weekly vitamin D  Preterm labor symptoms and general obstetric precautions including but not limited to vaginal bleeding, contractions, leaking of fluid  and fetal movement were reviewed in detail with the patient. Please refer to After Visit Summary for other counseling recommendations.  Return in about 4 weeks (around 10/27/2017) for ROB.   Roe Coombsachelle A Yitzel Shasteen, CNM

## 2017-09-30 LAB — CBC
HEMATOCRIT: 34.2 % (ref 34.0–46.6)
Hemoglobin: 11.1 g/dL (ref 11.1–15.9)
MCH: 26 pg — AB (ref 26.6–33.0)
MCHC: 32.5 g/dL (ref 31.5–35.7)
MCV: 80 fL (ref 79–97)
Platelets: 234 10*3/uL (ref 150–379)
RBC: 4.27 x10E6/uL (ref 3.77–5.28)
RDW: 14.9 % (ref 12.3–15.4)
WBC: 5.7 10*3/uL (ref 3.4–10.8)

## 2017-09-30 LAB — COMPREHENSIVE METABOLIC PANEL
ALT: 13 IU/L (ref 0–32)
AST: 13 IU/L (ref 0–40)
Albumin/Globulin Ratio: 1.5 (ref 1.2–2.2)
Albumin: 3.9 g/dL (ref 3.5–5.5)
Alkaline Phosphatase: 51 IU/L (ref 39–117)
BUN / CREAT RATIO: 13 (ref 9–23)
BUN: 7 mg/dL (ref 6–20)
Bilirubin Total: <0.2 mg/dL (ref 0.0–1.2)
CALCIUM: 9.2 mg/dL (ref 8.7–10.2)
CO2: 19 mmol/L — AB (ref 20–29)
CREATININE: 0.56 mg/dL — AB (ref 0.57–1.00)
Chloride: 105 mmol/L (ref 96–106)
GFR, EST AFRICAN AMERICAN: 148 mL/min/{1.73_m2} (ref >59)
GFR, EST NON AFRICAN AMERICAN: 128 mL/min/{1.73_m2} (ref >59)
GLUCOSE: 91 mg/dL (ref 65–99)
Globulin, Total: 2.6 g/dL (ref 1.5–4.5)
Potassium: 4.2 mmol/L (ref 3.5–5.2)
Sodium: 137 mmol/L (ref 134–144)
TOTAL PROTEIN: 6.5 g/dL (ref 6.0–8.5)

## 2017-09-30 LAB — PROTEIN / CREATININE RATIO, URINE
Creatinine, Urine: 38 mg/dL
Protein, Ur: 12 mg/dL
Protein/Creat Ratio: 316 mg/g creat — ABNORMAL HIGH (ref 0–200)

## 2017-10-01 ENCOUNTER — Other Ambulatory Visit: Payer: Self-pay | Admitting: Certified Nurse Midwife

## 2017-10-02 ENCOUNTER — Other Ambulatory Visit: Payer: Self-pay | Admitting: Certified Nurse Midwife

## 2017-10-02 DIAGNOSIS — Z3480 Encounter for supervision of other normal pregnancy, unspecified trimester: Secondary | ICD-10-CM

## 2017-10-02 LAB — AFP, SERUM, OPEN SPINA BIFIDA
AFP MoM: 1.49
AFP Value: 43.3 ng/mL
Gest. Age on Collection Date: 16.4 wk
Maternal Age At EDD: 28.3 a
OSBR Risk 1 IN: 2809
Test Results:: NEGATIVE
Weight: 191 [lb_av]

## 2017-10-06 ENCOUNTER — Encounter: Payer: Self-pay | Admitting: Certified Nurse Midwife

## 2017-10-07 ENCOUNTER — Other Ambulatory Visit: Payer: Self-pay | Admitting: Certified Nurse Midwife

## 2017-10-20 ENCOUNTER — Other Ambulatory Visit: Payer: Self-pay | Admitting: Certified Nurse Midwife

## 2017-10-20 ENCOUNTER — Ambulatory Visit (HOSPITAL_COMMUNITY)
Admission: RE | Admit: 2017-10-20 | Discharge: 2017-10-20 | Disposition: A | Discharge status: Home / Self Care | Payer: Medicaid Other | Source: Ambulatory Visit | Attending: Certified Nurse Midwife | Admitting: Certified Nurse Midwife

## 2017-10-20 DIAGNOSIS — Z3A19 19 weeks gestation of pregnancy: Secondary | ICD-10-CM | POA: Diagnosis not present

## 2017-10-20 DIAGNOSIS — Z363 Encounter for antenatal screening for malformations: Secondary | ICD-10-CM

## 2017-10-20 DIAGNOSIS — Z3480 Encounter for supervision of other normal pregnancy, unspecified trimester: Secondary | ICD-10-CM

## 2017-10-20 DIAGNOSIS — Z3482 Encounter for supervision of other normal pregnancy, second trimester: Secondary | ICD-10-CM

## 2017-10-27 ENCOUNTER — Ambulatory Visit (INDEPENDENT_AMBULATORY_CARE_PROVIDER_SITE_OTHER): Payer: Medicaid Other | Admitting: Certified Nurse Midwife

## 2017-10-27 ENCOUNTER — Encounter: Payer: Self-pay | Admitting: Certified Nurse Midwife

## 2017-10-27 VITALS — BP 134/81 | HR 99 | Wt 195.0 lb

## 2017-10-27 DIAGNOSIS — Z3480 Encounter for supervision of other normal pregnancy, unspecified trimester: Secondary | ICD-10-CM

## 2017-10-27 DIAGNOSIS — E559 Vitamin D deficiency, unspecified: Secondary | ICD-10-CM

## 2017-10-27 NOTE — Progress Notes (Addendum)
   PRENATAL VISIT NOTE  Subjective:  Pamela Brandt is a 27 y.o. G2P0010 at 6857w4d being seen today for ongoing prenatal care.  She is currently monitored for the following issues for this low-risk pregnancy and has Diverticulitis; Hx of iron deficiency anemia; Incomplete abortion; Microcytic anemia; Supervision of other normal pregnancy, antepartum; Vitamin D deficiency; and Elevated blood pressure affecting pregnancy in second trimester, antepartum on their problem list.  Patient reports no complaints.  Contractions: Not present. Vag. Bleeding: None.  Movement: Present. Denies leaking of fluid.   The following portions of the patient's history were reviewed and updated as appropriate: allergies, current medications, past family history, past medical history, past social history, past surgical history and problem list. Problem list updated.  Objective:   Vitals:   10/27/17 0830  BP: 134/81  Pulse: 99  Weight: 195 lb (88.5 kg)    Fetal Status: Fetal Heart Rate (bpm): 140; doppler Fundal Height: 20 cm Movement: Present     General:  Alert, oriented and cooperative. Patient is in no acute distress.  Skin: Skin is warm and dry. No rash noted.   Cardiovascular: Normal heart rate noted  Respiratory: Normal respiratory effort, no problems with respiration noted  Abdomen: Soft, gravid, appropriate for gestational age.  Pain/Pressure: Present     Pelvic: Cervical exam deferred        Extremities: Normal range of motion.  Edema: None  Mental Status:  Normal mood and affect. Normal behavior. Normal judgment and thought content.   Assessment and Plan:  Pregnancy: G2P0010 at 5557w4d  1. Supervision of other normal pregnancy, antepartum      Doing well. F/U US anatomy scheduled for 12/01/17.   2. Vitamin D deficiency     Taking weekly vitamin D weekly.   Preterm labor symptoms and general obstetric precautions including but not limited to vaginal bleeding, contractions, leaking of fluid and  fetal movement were reviewed in detail with the patient. Please refer to After Visit Summary for other counseling recommendations.  Return in about 4 weeks (around 11/24/2017) for ROB.   Roe Coombsachelle A Keldon Lassen, CNM

## 2017-12-01 ENCOUNTER — Other Ambulatory Visit: Payer: Self-pay | Admitting: Certified Nurse Midwife

## 2017-12-01 ENCOUNTER — Ambulatory Visit (HOSPITAL_COMMUNITY)
Admission: RE | Admit: 2017-12-01 | Discharge: 2017-12-01 | Disposition: A | Discharge status: Home / Self Care | Payer: 59 | Source: Ambulatory Visit | Attending: Certified Nurse Midwife | Admitting: Certified Nurse Midwife

## 2017-12-01 ENCOUNTER — Other Ambulatory Visit (HOSPITAL_COMMUNITY): Payer: Self-pay | Admitting: *Deleted

## 2017-12-01 ENCOUNTER — Ambulatory Visit (HOSPITAL_COMMUNITY): Payer: Medicaid Other

## 2017-12-01 ENCOUNTER — Ambulatory Visit (INDEPENDENT_AMBULATORY_CARE_PROVIDER_SITE_OTHER): Payer: Medicaid Other | Admitting: Certified Nurse Midwife

## 2017-12-01 VITALS — BP 137/81 | HR 102 | Wt 198.0 lb

## 2017-12-01 DIAGNOSIS — O162 Unspecified maternal hypertension, second trimester: Secondary | ICD-10-CM

## 2017-12-01 DIAGNOSIS — Z3480 Encounter for supervision of other normal pregnancy, unspecified trimester: Secondary | ICD-10-CM

## 2017-12-01 DIAGNOSIS — Z362 Encounter for other antenatal screening follow-up: Secondary | ICD-10-CM

## 2017-12-01 DIAGNOSIS — Z3A25 25 weeks gestation of pregnancy: Secondary | ICD-10-CM

## 2017-12-01 DIAGNOSIS — N1330 Unspecified hydronephrosis: Secondary | ICD-10-CM

## 2017-12-01 DIAGNOSIS — E559 Vitamin D deficiency, unspecified: Secondary | ICD-10-CM

## 2017-12-01 DIAGNOSIS — R9389 Abnormal findings on diagnostic imaging of other specified body structures: Secondary | ICD-10-CM

## 2017-12-01 NOTE — Patient Instructions (Signed)
Glucose Tolerance Test During Pregnancy The glucose tolerance test (GTT) is a blood test used to determine if you have developed a type of diabetes during pregnancy (gestational diabetes). This is when your body does not properly process sugar (glucose) in the food you eat, resulting in high blood glucose levels. Typically, a GTT is done after you have had a 1-hour glucose test with results that indicate you possibly have gestational diabetes. It may also be done if:  You have a history of giving birth to very large babies or have experienced repeated fetal loss (stillbirth).  You have signs and symptoms of diabetes, such as: ? Changes in your vision. ? Tingling or numbness in your hands or feet. ? Changes in hunger, thirst, and urination not otherwise explained by your pregnancy.  The GTT lasts about 3 hours. You will be given a sugar-water solution to drink at the beginning of the test. You will have blood drawn before you drink the solution and then again 1, 2, and 3 hours after you drink it. You will not be allowed to eat or drink anything else during the test. You must remain at the testing location to make sure that your blood is drawn on time. You should also avoid exercising during the test, because exercise can alter test results. How do I prepare for this test? Eat normally for 3 days prior to the GTT test, including having plenty of carbohydrate-rich foods. Do not eat or drink anything except water during the final 12 hours before the test. In addition, your health care provider may ask you to stop taking certain medicines before the test. What do the results mean? It is your responsibility to obtain your test results. Ask the lab or department performing the test when and how you will get your results. Contact your health care provider to discuss any questions you have about your results. Range of Normal Values Ranges for normal values may vary among different labs and hospitals. You  should always check with your health care provider after having lab work or other tests done to discuss whether your values are considered within normal limits. Normal levels of blood glucose are as follows:  Fasting: less than 105 mg/dL.  1 hour after drinking the solution: less than 190 mg/dL.  2 hours after drinking the solution: less than 165 mg/dL.  3 hours after drinking the solution: less than 145 mg/dL.  Some substances can interfere with GTT results. These may include:  Blood pressure and heart failure medicines, including beta blockers, furosemide, and thiazides.  Anti-inflammatory medicines, including aspirin.  Nicotine.  Some psychiatric medicines.  Meaning of Results Outside Normal Value Ranges GTT test results that are above normal values may indicate a number of health problems, such as:  Gestational diabetes.  Acute stress response.  Cushing syndrome.  Tumors such as pheochromocytoma or glucagonoma.  Long-term kidney problems.  Pancreatitis.  Hyperthyroidism.  Current infection.  Discuss your test results with your health care provider. He or she will use the results to make a diagnosis and determine a treatment plan that is right for you. This information is not intended to replace advice given to you by your health care provider. Make sure you discuss any questions you have with your health care provider. Document Released: 02/10/2012 Document Revised: 01/17/2016 Document Reviewed: 12/16/2013 Elsevier Interactive Patient Education  2018 [employer]  Second Trimester of Pregnancy The second trimester is from week 14 through week 27 (months 4 through 6). The second   trimester is often a time when you feel your best. Your body has adjusted to being pregnant, and you begin to feel better physically. Usually, morning sickness has lessened or quit completely, you may have more energy, and you may have an increase in appetite. The second trimester is also a  time when the fetus is growing rapidly. At the end of the sixth month, the fetus is about 9 inches long and weighs about 1 pounds. You will likely begin to feel the baby move (quickening) between 16 and 20 weeks of pregnancy. Body changes during your second trimester Your body continues to go through many changes during your second trimester. The changes vary from woman to woman.  Your weight will continue to increase. You will notice your lower abdomen bulging out.  You may begin to get stretch marks on your hips, abdomen, and breasts.  You may develop headaches that can be relieved by medicines. The medicines should be approved by your health care provider.  You may urinate more often because the fetus is pressing on your bladder.  You may develop or continue to have heartburn as a result of your pregnancy.  You may develop constipation because certain hormones are causing the muscles that push waste through your intestines to slow down.  You may develop hemorrhoids or swollen, bulging veins (varicose veins).  You may have back pain. This is caused by: ? Weight gain. ? Pregnancy hormones that are relaxing the joints in your pelvis. ? A shift in weight and the muscles that support your balance.  Your breasts will continue to grow and they will continue to become tender.  Your gums may bleed and may be sensitive to brushing and flossing.  Dark spots or blotches (chloasma, mask of pregnancy) may develop on your face. This will likely fade after the baby is born.  A dark line from your belly button to the pubic area (linea nigra) may appear. This will likely fade after the baby is born.  You may have changes in your hair. These can include thickening of your hair, rapid growth, and changes in texture. Some women also have hair loss during or after pregnancy, or hair that feels dry or thin. Your hair will most likely return to normal after your baby is born.  What to expect at prenatal  visits During a routine prenatal visit:  You will be weighed to make sure you and the fetus are growing normally.  Your blood pressure will be taken.  Your abdomen will be measured to track your baby's growth.  The fetal heartbeat will be listened to.  Any test results from the previous visit will be discussed.  Your health care provider may ask you:  How you are feeling.  If you are feeling the baby move.  If you have had any abnormal symptoms, such as leaking fluid, bleeding, severe headaches, or abdominal cramping.  If you are using any tobacco products, including cigarettes, chewing tobacco, and electronic cigarettes.  If you have any questions.  Other tests that may be performed during your second trimester include:  Blood tests that check for: ? Low iron levels (anemia). ? High blood sugar that affects pregnant women (gestational diabetes) between 24 and 28 weeks. ? Rh antibodies. This is to check for a protein on red blood cells (Rh factor).  Urine tests to check for infections, diabetes, or protein in the urine.  An ultrasound to confirm the proper growth and development of the baby.  An   amniocentesis to check for possible genetic problems.  Fetal screens for spina bifida and Down syndrome.  HIV (human immunodeficiency virus) testing. Routine prenatal testing includes screening for HIV, unless you choose not to have this test.  Follow these instructions at home: Medicines  Follow your health care provider's instructions regarding medicine use. Specific medicines may be either safe or unsafe to take during pregnancy.  Take a prenatal vitamin that contains at least 600 micrograms (mcg) of folic acid.  If you develop constipation, try taking a stool softener if your health care provider approves. Eating and drinking  Eat a balanced diet that includes fresh fruits and vegetables, whole grains, good sources of protein such as meat, eggs, or tofu, and low-fat  dairy. Your health care provider will help you determine the amount of weight gain that is right for you.  Avoid raw meat and uncooked cheese. These carry germs that can cause birth defects in the baby.  If you have low calcium intake from food, talk to your health care provider about whether you should take a daily calcium supplement.  Limit foods that are high in fat and processed sugars, such as fried and sweet foods.  To prevent constipation: ? Drink enough fluid to keep your urine clear or pale yellow. ? Eat foods that are high in fiber, such as fresh fruits and vegetables, whole grains, and beans. Activity  Exercise only as directed by your health care provider. Most women can continue their usual exercise routine during pregnancy. Try to exercise for 30 minutes at least 5 days a week. Stop exercising if you experience uterine contractions.  Avoid heavy lifting, wear low heel shoes, and practice good posture.  A sexual relationship may be continued unless your health care provider directs you otherwise. Relieving pain and discomfort  Wear a good support bra to prevent discomfort from breast tenderness.  Take warm sitz baths to soothe any pain or discomfort caused by hemorrhoids. Use hemorrhoid cream if your health care provider approves.  Rest with your legs elevated if you have leg cramps or low back pain.  If you develop varicose veins, wear support hose. Elevate your feet for 15 minutes, 3-4 times a day. Limit salt in your diet. Prenatal Care  Write down your questions. Take them to your prenatal visits.  Keep all your prenatal visits as told by your health care provider. This is important. Safety  Wear your seat belt at all times when driving.  Make a list of emergency phone numbers, including numbers for family, friends, the hospital, and police and fire departments. General instructions  Ask your health care provider for a referral to a local prenatal education  class. Begin classes no later than the beginning of month 6 of your pregnancy.  Ask for help if you have counseling or nutritional needs during pregnancy. Your health care provider can offer advice or refer you to specialists for help with various needs.  Do not use hot tubs, steam rooms, or saunas.  Do not douche or use tampons or scented sanitary pads.  Do not cross your legs for long periods of time.  Avoid cat litter boxes and soil used by cats. These carry germs that can cause birth defects in the baby and possibly loss of the fetus by miscarriage or stillbirth.  Avoid all smoking, herbs, alcohol, and unprescribed drugs. Chemicals in these products can affect the formation and growth of the baby.  Do not use any products that contain nicotine or tobacco,   such as cigarettes and e-cigarettes. If you need help quitting, ask your health care provider.  Visit your dentist if you have not gone yet during your pregnancy. Use a soft toothbrush to brush your teeth and be gentle when you floss. Contact a health care provider if:  You have dizziness.  You have mild pelvic cramps, pelvic pressure, or nagging pain in the abdominal area.  You have persistent nausea, vomiting, or diarrhea.  You have a bad smelling vaginal discharge.  You have pain when you urinate. Get help right away if:  You have a fever.  You are leaking fluid from your vagina.  You have spotting or bleeding from your vagina.  You have severe abdominal cramping or pain.  You have rapid weight gain or weight loss.  You have shortness of breath with chest pain.  You notice sudden or extreme swelling of your face, hands, ankles, feet, or legs.  You have not felt your baby move in over an hour.  You have severe headaches that do not go away when you take medicine.  You have vision changes. Summary  The second trimester is from week 14 through week 27 (months 4 through 6). It is also a time when the fetus is  growing rapidly.  Your body goes through many changes during pregnancy. The changes vary from woman to woman.  Avoid all smoking, herbs, alcohol, and unprescribed drugs. These chemicals affect the formation and growth your baby.  Do not use any tobacco products, such as cigarettes, chewing tobacco, and e-cigarettes. If you need help quitting, ask your health care provider.  Contact your health care provider if you have any questions. Keep all prenatal visits as told by your health care provider. This is important. This information is not intended to replace advice given to you by your health care provider. Make sure you discuss any questions you have with your health care provider. Document Released: 08/05/2001 Document Revised: 09/16/2016 Document Reviewed: 09/16/2016 Elsevier Interactive Patient Education  2018 [employer]  Preventing Hypertension Hypertension, commonly called high blood pressure, is when the force of blood pumping through the arteries is too strong. Arteries are blood vessels that carry blood from the heart throughout the body. Over time, hypertension can damage the arteries and decrease blood flow to important parts of the body, including the brain, heart, and kidneys. Often, hypertension does not cause symptoms until blood pressure is very high. For this reason, it is important to have your blood pressure checked on a regular basis. Hypertension can often be prevented with diet and lifestyle changes. If you already have hypertension, you can control it with diet and lifestyle changes, as well as medicine. What nutrition changes can be made? Maintain a healthy diet. This includes:  Eating less salt (sodium). Ask your health care provider how much sodium is safe for you to have. The general recommendation is to consume less than 1 tsp (2,300 mg) of sodium a day. ? Do not add salt to your food. ? Choose low-sodium options when grocery shopping and eating out.  Limiting  fats in your diet. You can do this by eating low-fat or fat-free dairy products and by eating less red meat.  Eating more fruits, vegetables, and whole grains. Make a goal to eat: ? 1-2 cups of fresh fruits and vegetables each day. ? 3-4 servings of whole grains each day.  Avoiding foods and beverages that have added sugars.  Eating fish that contain healthy fats (omega-3 fatty acids), such  as mackerel or salmon.  If you need help putting together a healthy eating plan, try the DASH diet. This diet is high in fruits, vegetables, and whole grains. It is low in sodium, red meat, and added sugars. DASH stands for Dietary Approaches to Stop Hypertension. What lifestyle changes can be made?  Lose weight if you are overweight. Losing just 3?5% of your body weight can help prevent or control hypertension. ? For example, if your present weight is 200 lb (91 kg), a loss of 3-5% of your weight means losing 6-10 lb (2.7-4.5 kg). ? Ask your health care provider to help you with a diet and exercise plan to safely lose weight.  Get enough exercise. Do at least 150 minutes of moderate-intensity exercise each week. ? You could do this in short exercise sessions several times a day, or you could do longer exercise sessions a few times a week. For example, you could take a brisk 10-minute walk or bike ride, 3 times a day, for 5 days a week.  Find ways to reduce stress, such as exercising, meditating, listening to music, or taking a yoga class. If you need help reducing stress, ask your health care provider.  Do not smoke. This includes e-cigarettes. Chemicals in tobacco and nicotine products raise your blood pressure each time you smoke. If you need help quitting, ask your health care provider.  Avoid alcohol. If you drink alcohol, limit alcohol intake to no more than 1 drink a day for nonpregnant women and 2 drinks a day for men. One drink equals 12 oz of beer, 5 oz of wine, or 1 oz of hard liquor. Why are  these changes important? Diet and lifestyle changes can help you prevent hypertension, and they may make you feel better overall and improve your quality of life. If you have hypertension, making these changes will help you control it and help prevent major complications, such as:  Hardening and narrowing of arteries that supply blood to: ? Your heart. This can cause a heart attack. ? Your brain. This can cause a stroke. ? Your kidneys. This can cause kidney failure.  Stress on your heart muscle, which can cause heart failure.  What can I do to lower my risk?  Work with your health care provider to make a hypertension prevention plan that works for you. Follow your plan and keep all follow-up visits as told by your health care provider.  Learn how to check your blood pressure at home. Make sure that you know your personal target blood pressure, as told by your health care provider. How is this treated? In addition to diet and lifestyle changes, your health care provider may recommend medicines to help lower your blood pressure. You may need to try a few different medicines to find what works best for you. You also may need to take more than one medicine. Take over-the-counter and prescription medicines only as told by your health care provider. Where to find support: Your health care provider can help you prevent hypertension and help you keep your blood pressure at a healthy level. Your local hospital or your community may also provide support services and prevention programs. The American Heart Association offers an online support network at: https://www.lee.net/ Where to find more information: Learn more about hypertension from:  National Heart, Lung, and Blood Institute: https://www.peterson.org/  Centers for Disease Control and Prevention: AboutHD.co.nz  American Academy of Family Physicians:  http://familydoctor.org/familydoctor/en/diseases-conditions/high-blood-pressure.printerview.all.html  Learn more about the DASH diet from:  [employer]  Heart, Lung, and Blood Institute: WedMap.it  Contact a health care provider if:  You think you are having a reaction to medicines you have taken.  You have recurrent headaches or feel dizzy.  You have swelling in your ankles.  You have trouble with your vision. Summary  Hypertension often does not cause any symptoms until blood pressure is very high. It is important to get your blood pressure checked regularly.  Diet and lifestyle changes are the most important steps in preventing hypertension.  By keeping your blood pressure in a healthy range, you can prevent complications like heart attack, heart failure, stroke, and kidney failure.  Work with your health care provider to make a hypertension prevention plan that works for you. This information is not intended to replace advice given to you by your health care provider. Make sure you discuss any questions you have with your health care provider. Document Released: 08/26/2015 Document Revised: 04/21/2016 Document Reviewed: 04/21/2016 Elsevier Interactive Patient Education  [employer].

## 2017-12-01 NOTE — Progress Notes (Signed)
   PRENATAL VISIT NOTE  Subjective:  Pamela Brandt is a 27 y.o. G2P0010 at 767w4d being seen today for ongoing prenatal care.  She is currently monitored for the following issues for this low-risk pregnancy and has Diverticulitis; Hx of iron deficiency anemia; Incomplete abortion; Microcytic anemia; Supervision of other normal pregnancy, antepartum; Vitamin D deficiency; and Elevated blood pressure affecting pregnancy in second trimester, antepartum on their problem list.  Patient reports no complaints.  Contractions: Not present. Vag. Bleeding: None.  Movement: Present. Denies leaking of fluid.   The following portions of the patient's history were reviewed and updated as appropriate: allergies, current medications, past family history, past medical history, past social history, past surgical history and problem list. Problem list updated.  Objective:   Vitals:   12/01/17 0902  BP: 137/81  Pulse: (!) 102  Weight: 198 lb (89.8 kg)    Fetal Status: Fetal Heart Rate (bpm): 154; doppler Fundal Height: 26 cm Movement: Present     General:  Alert, oriented and cooperative. Patient is in no acute distress.  Skin: Skin is warm and dry. No rash noted.   Cardiovascular: Normal heart rate noted  Respiratory: Normal respiratory effort, no problems with respiration noted  Abdomen: Soft, gravid, appropriate for gestational age.  Pain/Pressure: Absent     Pelvic: Cervical exam deferred        Extremities: Normal range of motion.     Mental Status: Normal mood and affect. Normal behavior. Normal judgment and thought content.   Assessment and Plan:  Pregnancy: G2P0010 at 5667w4d  1. Supervision of other normal pregnancy, antepartum      Doing well. Has f/u US scheduled for today.    2. Elevated blood pressure affecting pregnancy in second trimester, antepartum     Normotensive today  3. Vitamin D deficiency     Taking weekly vitamin D  Preterm labor symptoms and general obstetric precautions  including but not limited to vaginal bleeding, contractions, leaking of fluid and fetal movement were reviewed in detail with the patient. Please refer to After Visit Summary for other counseling recommendations.  Return in about 2 weeks (around 12/15/2017) for ROB, 2 hr OGTT.  Future Appointments  Date Time Provider Department Center  12/01/2017 10:15 AM [facility] US 4 [facility]    Roe Coombsachelle A Madalene Mickler, CNM.

## 2017-12-03 ENCOUNTER — Encounter: Payer: Self-pay | Admitting: Certified Nurse Midwife

## 2017-12-15 ENCOUNTER — Other Ambulatory Visit: Payer: Medicaid Other

## 2017-12-15 ENCOUNTER — Encounter: Payer: Self-pay | Admitting: Certified Nurse Midwife

## 2017-12-15 ENCOUNTER — Ambulatory Visit (INDEPENDENT_AMBULATORY_CARE_PROVIDER_SITE_OTHER): Payer: Medicaid Other | Admitting: Certified Nurse Midwife

## 2017-12-15 ENCOUNTER — Other Ambulatory Visit: Payer: Self-pay

## 2017-12-15 VITALS — BP 125/82 | HR 114 | Wt 202.5 lb

## 2017-12-15 DIAGNOSIS — E559 Vitamin D deficiency, unspecified: Secondary | ICD-10-CM

## 2017-12-15 DIAGNOSIS — Z3480 Encounter for supervision of other normal pregnancy, unspecified trimester: Secondary | ICD-10-CM

## 2017-12-15 NOTE — Progress Notes (Signed)
   PRENATAL VISIT NOTE  Subjective:  Pamela Brandt is a 27 y.o. G2P0010 at 2333w4d being seen today for ongoing prenatal care.  She is currently monitored for the following issues for this low-risk pregnancy and has Diverticulitis; Hx of iron deficiency anemia; Incomplete abortion; Microcytic anemia; Supervision of other normal pregnancy, antepartum; Vitamin D deficiency; and Elevated blood pressure affecting pregnancy in second trimester, antepartum on their problem list.  Patient reports no complaints.  Contractions: Not present. Vag. Bleeding: None.  Movement: Present. Denies leaking of fluid.   The following portions of the patient's history were reviewed and updated as appropriate: allergies, current medications, past family history, past medical history, past social history, past surgical history and problem list. Problem list updated.  Objective:   Vitals:   12/15/17 0850  BP: 125/82  Pulse: (!) 114  Weight: 202 lb 8 oz (91.9 kg)    Fetal Status: Fetal Heart Rate (bpm): 157; doppler Fundal Height: 28 cm Movement: Present     General:  Alert, oriented and cooperative. Patient is in no acute distress.  Skin: Skin is warm and dry. No rash noted.   Cardiovascular: Normal heart rate noted  Respiratory: Normal respiratory effort, no problems with respiration noted  Abdomen: Soft, gravid, appropriate for gestational age.  Pain/Pressure: Present     Pelvic: Cervical exam deferred        Extremities: Normal range of motion.  Edema: None  Mental Status: Normal mood and affect. Normal behavior. Normal judgment and thought content.   Assessment and Plan:  Pregnancy: G2P0010 at 4733w4d  1. Supervision of other normal pregnancy, antepartum      Doing well.  Has f/u US scheduled.   - Glucose Tolerance, 2 Hours w/1 Hour - CBC - HIV antibody (with reflex) - RPR  2. Vitamin D deficiency     Taking weekly vitamin D.   Preterm labor symptoms and general obstetric precautions including but  not limited to vaginal bleeding, contractions, leaking of fluid and fetal movement were reviewed in detail with the patient. Please refer to After Visit Summary for other counseling recommendations.  Return in about 2 weeks (around 12/29/2017) for ROB.  Future Appointments  Date Time Provider Department Center  12/29/2017 10:15 AM [facility] US 4 [facility]-US    Roe Coombsachelle A Ramah Langhans, CNM

## 2017-12-15 NOTE — Progress Notes (Signed)
ROB/GTT.  Declined TDAP. 

## 2017-12-16 ENCOUNTER — Other Ambulatory Visit: Payer: Self-pay | Admitting: Certified Nurse Midwife

## 2017-12-16 DIAGNOSIS — Z3480 Encounter for supervision of other normal pregnancy, unspecified trimester: Secondary | ICD-10-CM

## 2017-12-16 LAB — HIV ANTIBODY (ROUTINE TESTING W REFLEX): HIV SCREEN 4TH GENERATION: NONREACTIVE

## 2017-12-16 LAB — RPR: RPR Ser Ql: NONREACTIVE

## 2017-12-16 LAB — CBC
Hematocrit: 35.2 % (ref 34.0–46.6)
Hemoglobin: 11.4 g/dL (ref 11.1–15.9)
MCH: 27.3 pg (ref 26.6–33.0)
MCHC: 32.4 g/dL (ref 31.5–35.7)
MCV: 84 fL (ref 79–97)
PLATELETS: 225 10*3/uL (ref 150–379)
RBC: 4.17 x10E6/uL (ref 3.77–5.28)
RDW: 13.8 % (ref 12.3–15.4)
WBC: 7.5 10*3/uL (ref 3.4–10.8)

## 2017-12-16 LAB — GLUCOSE TOLERANCE, 2 HOURS W/ 1HR
GLUCOSE, 1 HOUR: 109 mg/dL (ref 65–179)
GLUCOSE, FASTING: 79 mg/dL (ref 65–91)
Glucose, 2 hour: 112 mg/dL (ref 65–152)

## 2017-12-21 ENCOUNTER — Encounter: Payer: Self-pay | Admitting: Certified Nurse Midwife

## 2017-12-22 ENCOUNTER — Encounter: Payer: Self-pay | Admitting: Certified Nurse Midwife

## 2017-12-29 ENCOUNTER — Ambulatory Visit (HOSPITAL_COMMUNITY)
Admission: RE | Admit: 2017-12-29 | Discharge: 2017-12-29 | Disposition: A | Discharge status: Home / Self Care | Payer: Medicaid Other | Source: Ambulatory Visit | Attending: Certified Nurse Midwife | Admitting: Certified Nurse Midwife

## 2017-12-29 ENCOUNTER — Other Ambulatory Visit (HOSPITAL_COMMUNITY): Payer: Self-pay | Admitting: Obstetrics and Gynecology

## 2017-12-29 ENCOUNTER — Ambulatory Visit (INDEPENDENT_AMBULATORY_CARE_PROVIDER_SITE_OTHER): Payer: Medicaid Other | Admitting: Certified Nurse Midwife

## 2017-12-29 VITALS — BP 129/79 | HR 102 | Wt 202.2 lb

## 2017-12-29 DIAGNOSIS — Z3A29 29 weeks gestation of pregnancy: Secondary | ICD-10-CM | POA: Diagnosis not present

## 2017-12-29 DIAGNOSIS — R9389 Abnormal findings on diagnostic imaging of other specified body structures: Secondary | ICD-10-CM

## 2017-12-29 DIAGNOSIS — O359XX0 Maternal care for (suspected) fetal abnormality and damage, unspecified, not applicable or unspecified: Secondary | ICD-10-CM

## 2017-12-29 DIAGNOSIS — O219 Vomiting of pregnancy, unspecified: Secondary | ICD-10-CM

## 2017-12-29 DIAGNOSIS — O162 Unspecified maternal hypertension, second trimester: Secondary | ICD-10-CM

## 2017-12-29 DIAGNOSIS — Z362 Encounter for other antenatal screening follow-up: Secondary | ICD-10-CM | POA: Diagnosis present

## 2017-12-29 DIAGNOSIS — Z3480 Encounter for supervision of other normal pregnancy, unspecified trimester: Secondary | ICD-10-CM

## 2017-12-29 DIAGNOSIS — K219 Gastro-esophageal reflux disease without esophagitis: Secondary | ICD-10-CM

## 2017-12-29 DIAGNOSIS — E559 Vitamin D deficiency, unspecified: Secondary | ICD-10-CM

## 2017-12-29 DIAGNOSIS — O99613 Diseases of the digestive system complicating pregnancy, third trimester: Secondary | ICD-10-CM

## 2017-12-29 MED ORDER — OMEPRAZOLE 20 MG PO CPDR: 20.0000 mg | DELAYED_RELEASE_CAPSULE | Freq: Two times a day (BID) | ORAL | 5 refills | Status: DC

## 2017-12-29 MED ORDER — PROMETHAZINE HCL 12.5 MG PO TABS: 12.5000 mg | ORAL_TABLET | Freq: Four times a day (QID) | ORAL | 0 refills | Status: DC | PRN

## 2017-12-29 MED ORDER — ONDANSETRON 8 MG PO TBDP: 8.0000 mg | ORAL_TABLET | Freq: Three times a day (TID) | ORAL | 0 refills | Status: DC | PRN

## 2017-12-29 NOTE — Progress Notes (Signed)
   PRENATAL VISIT NOTE  Subjective:  Pamela Brandt is a 27 y.o. G2P0010 at [redacted]w[redacted]d being seen today for ongoing prenatal care.  She is currently monitored for the following issues for this low-risk pregnancy and has Diverticulitis; Hx of iron deficiency anemia; Incomplete abortion; Microcytic anemia; Supervision of other normal pregnancy, antepartum; Vitamin D deficiency; and Elevated blood pressure affecting pregnancy in second trimester, antepartum on their problem list.  Patient reports heartburn, nausea, no bleeding, no contractions, no cramping, no leaking and vomiting.  Contractions: Not present. Vag. Bleeding: None.  Movement: Present. Denies leaking of fluid.   The following portions of the patient's history were reviewed and updated as appropriate: allergies, current medications, past family history, past medical history, past social history, past surgical history and problem list. Problem list updated.  Objective:   Vitals:   12/29/17 0904  BP: 129/79  Pulse: (!) 102  Weight: 202 lb 3.2 oz (91.7 kg)    Fetal Status: Fetal Heart Rate (bpm): 152; doppler Fundal Height: 30 cm Movement: Present     General:  Alert, oriented and cooperative. Patient is in no acute distress.  Skin: Skin is warm and dry. No rash noted.   Cardiovascular: Normal heart rate noted  Respiratory: Normal respiratory effort, no problems with respiration noted  Abdomen: Soft, gravid, appropriate for gestational age.  Pain/Pressure: Present     Pelvic: Cervical exam deferred        Extremities: Normal range of motion.  Edema: None  Mental Status: Normal mood and affect. Normal behavior. Normal judgment and thought content.   Assessment and Plan:  Pregnancy: G2P0010 at [redacted]w[redacted]d  1. Supervision of other normal pregnancy, antepartum     Has f/u anatomy US today  2. Vitamin D deficiency     Taking weekly vitamin D  3. Elevated blood pressure affecting pregnancy in second trimester, antepartum  Normotensive since 16 weeks  4. Gastroesophageal reflux during pregnancy, antepartum, third trimester      OTC Tums - omeprazole (PRILOSEC) 20 MG capsule; Take 1 capsule (20 mg total) by mouth 2 (two) times daily before a meal.  Dispense: 60 capsule; Refill: 5  5. Nausea/vomiting in pregnancy       - ondansetron (ZOFRAN ODT) 8 MG disintegrating tablet; Take 1 tablet (8 mg total) by mouth every 8 (eight) hours as needed for nausea or vomiting.  Dispense: 20 tablet; Refill: 0 - promethazine (PHENERGAN) 12.5 MG tablet; Take 1 tablet (12.5 mg total) by mouth every 6 (six) hours as needed for nausea or vomiting.  Dispense: 30 tablet; Refill: 0  Preterm labor symptoms and general obstetric precautions including but not limited to vaginal bleeding, contractions, leaking of fluid and fetal movement were reviewed in detail with the patient. Please refer to After Visit Summary for other counseling recommendations.  Return in about 2 weeks (around 01/12/2018) for ROB.  Future Appointments  Date Time Provider Department Center  12/29/2017 10:15 AM [facility] [place] 4 [facility]-US    Roe Coombs, CNM

## 2017-12-29 NOTE — Patient Instructions (Signed)
AREA PEDIATRIC/FAMILY PRACTICE PHYSICIANS  [facility] [address], [address] [place], [place]  [address] Phone - [phone]   Fax - [phone]  [facility] [address] [place], [place] [address] Phone - [phone]   Fax - [phone]  [facility] [address] [place], [place]  [address] Phone - [phone]   Fax - [phone]  [facility] [address] [place], [place]  [address] Phone - [phone]   Fax - [phone]  [facility] [address] [place], [place]  [address] Phone - [phone]   Fax - [phone]  [facility] [address] [place], [place]  [address] Phone - [phone]   Fax - [phone]  [facility] [address] [place], [place]  [address] Phone - [phone]   Fax - [phone]  [facility] [address], [address] [place], [place]  [address] Phone - [phone]   Fax - [phone]  [facility] [address] [place], [place]  [address] Phone - [phone]   Fax - [phone] [facility] [address] [place], [place]  [address] Phone - [phone]   Fax - [phone]  [facility] [address] [place], [place]  [address] Phone - [phone]   Fax - [phone]  [facility] [address] [place], [place]  [address] Phone - [phone]   Fax - [phone]  [facility] [address], [address] [place], [place]  [address] Phone - [phone]   Fax - [phone]  [facility] [address] [place], [place]  [address] Phone - [phone]  [facility] [address] [place], [place]  [address] Phone - [phone]   Fax - [phone]  [facility] [address] [place], [place]  [address] Phone - [phone]   Fax - [phone]  [facility] [address] [place], [place]  [address] Phone - [phone]   Fax - [phone]  [facility] [address] [place], [place]  [address] Phone - [phone]   Fax - [phone]   [facility] [address] [place], [place]  [address] Phone - [phone]   Fax - [phone]  [facility] [address] [place], [place]  [address] Phone - [phone]   Fax - [phone]  [facility] [address] [place], [place]  [address] Phone - [phone]   Fax - [phone]  [facility] [address] , [place]  [address] Phone - [phone]   Fax - [phone]  [facility] [address] , [place]  [address] Phone - [phone]   Fax - [phone] [facility] [address] [place], [place]  [address] Phone - [phone]   Fax - [phone]  [facility] [address] [place], [place]  [address] Phone - [phone]   Fax - [phone]   [facility] [address] [place], [place]  [address] Phone - [phone]   Fax - [phone]  [facility] Charlene Flemming MD [address] [place] [address] Phone [phone]  Fax [phone]   

## 2018-01-12 ENCOUNTER — Ambulatory Visit (INDEPENDENT_AMBULATORY_CARE_PROVIDER_SITE_OTHER): Payer: 59 | Admitting: Certified Nurse Midwife

## 2018-01-12 ENCOUNTER — Encounter: Payer: Self-pay | Admitting: Certified Nurse Midwife

## 2018-01-12 VITALS — BP 111/74 | HR 99 | Wt 201.0 lb

## 2018-01-12 DIAGNOSIS — O162 Unspecified maternal hypertension, second trimester: Secondary | ICD-10-CM

## 2018-01-12 DIAGNOSIS — Z3480 Encounter for supervision of other normal pregnancy, unspecified trimester: Secondary | ICD-10-CM

## 2018-01-12 DIAGNOSIS — Z3483 Encounter for supervision of other normal pregnancy, third trimester: Secondary | ICD-10-CM

## 2018-01-12 DIAGNOSIS — O163 Unspecified maternal hypertension, third trimester: Secondary | ICD-10-CM

## 2018-01-12 DIAGNOSIS — E559 Vitamin D deficiency, unspecified: Secondary | ICD-10-CM

## 2018-01-12 NOTE — Progress Notes (Signed)
   PRENATAL VISIT NOTE  Subjective:  Lucendia Leard is a 27 y.o. G2P0010 at [redacted]w[redacted]d being seen today for ongoing prenatal care.  She is currently monitored for the following issues for this low-risk pregnancy and has Diverticulitis; Hx of iron deficiency anemia; Incomplete abortion; Microcytic anemia; Supervision of other normal pregnancy, antepartum; Vitamin D deficiency; and Elevated blood pressure affecting pregnancy in second trimester, antepartum on their problem list.  Patient reports no complaints.  Contractions: Irritability.  .  Movement: Present. Denies leaking of fluid.   The following portions of the patient's history were reviewed and updated as appropriate: allergies, current medications, past family history, past medical history, past social history, past surgical history and problem list. Problem list updated.  Objective:   Vitals:   01/12/18 0935  BP: 111/74  Pulse: 99  Weight: 201 lb (91.2 kg)    Fetal Status: Fetal Heart Rate (bpm): 150; doppler Fundal Height: 32 cm Movement: Present     General:  Alert, oriented and cooperative. Patient is in no acute distress.  Skin: Skin is warm and dry. No rash noted.   Cardiovascular: Normal heart rate noted  Respiratory: Normal respiratory effort, no problems with respiration noted  Abdomen: Soft, gravid, appropriate for gestational age.  Pain/Pressure: Present     Pelvic: Cervical exam deferred        Extremities: Normal range of motion.     Mental Status: Normal mood and affect. Normal behavior. Normal judgment and thought content.   Assessment and Plan:  Pregnancy: G2P0010 at [redacted]w[redacted]d  1. Supervision of other normal pregnancy, antepartum     Doing well  2. Vitamin D deficiency     Taking weekly vitamin D  3. Elevated blood pressure affecting pregnancy in second trimester, antepartum     Normotensive since 16 wks.   Preterm labor symptoms and general obstetric precautions including but not limited to vaginal bleeding,  contractions, leaking of fluid and fetal movement were reviewed in detail with the patient. Please refer to After Visit Summary for other counseling recommendations.  Return in about 2 weeks (around 01/26/2018) for ROB.  No future appointments.  Roe Coombs, CNM

## 2018-01-26 ENCOUNTER — Other Ambulatory Visit (HOSPITAL_COMMUNITY)
Admission: RE | Admit: 2018-01-26 | Discharge: 2018-01-26 | Disposition: A | Discharge status: Home / Self Care | Payer: 59 | Source: Ambulatory Visit | Attending: Certified Nurse Midwife | Admitting: Certified Nurse Midwife

## 2018-01-26 ENCOUNTER — Other Ambulatory Visit: Payer: Self-pay

## 2018-01-26 ENCOUNTER — Ambulatory Visit (INDEPENDENT_AMBULATORY_CARE_PROVIDER_SITE_OTHER): Payer: 59 | Admitting: Certified Nurse Midwife

## 2018-01-26 VITALS — BP 123/75 | HR 95 | Wt 209.3 lb

## 2018-01-26 DIAGNOSIS — O26853 Spotting complicating pregnancy, third trimester: Secondary | ICD-10-CM | POA: Diagnosis not present

## 2018-01-26 DIAGNOSIS — Z3480 Encounter for supervision of other normal pregnancy, unspecified trimester: Secondary | ICD-10-CM

## 2018-01-26 DIAGNOSIS — Z3A33 33 weeks gestation of pregnancy: Secondary | ICD-10-CM | POA: Diagnosis not present

## 2018-01-26 DIAGNOSIS — O2243 Hemorrhoids in pregnancy, third trimester: Secondary | ICD-10-CM | POA: Insufficient documentation

## 2018-01-26 DIAGNOSIS — E559 Vitamin D deficiency, unspecified: Secondary | ICD-10-CM

## 2018-01-26 MED ORDER — HYDROCORTISONE ACETATE 25 MG RE SUPP: 25.0000 mg | Freq: Two times a day (BID) | RECTAL | 4 refills | Status: DC

## 2018-01-26 NOTE — Patient Instructions (Addendum)
AREA PEDIATRIC/FAMILY PRACTICE PHYSICIANS  [facility] [address], [address] [place], [place]  [address] Phone - [phone]   Fax - [phone]  [facility] [address] [place], [place] [address] Phone - [phone]   Fax - [phone]  JACK AMOS [address] [place], [place]  [address] Phone - [phone]   Fax - [phone]  [facility] [address] [place], [place]  [address] Phone - [phone]   Fax - [phone]  [facility] [address] [place], [place]  [address] Phone - [phone]   Fax - [phone]  [facility] [address] [place], [place]  [address] Phone - [phone]   Fax - [phone]  [facility] [address] [place], [place]  [address] Phone - [phone]   Fax - [phone]  [facility] [address], [address] [place], [place]  [address] Phone - [phone]   Fax - [phone]  [facility] [address] [place], [place]  [address] Phone - [phone]   Fax - [phone] [facility] [address] [place], [place]  [address] Phone - [phone]   Fax - [phone]  [facility] [address] [place], [place]  [address] Phone - [phone]   Fax - [phone]  [facility] [address] [place], [place]  [address] Phone - [phone]   Fax - [phone]  [facility] [address] [place], [place]  [address] Phone - [phone]   Fax - [phone]  SHILPA GOSRANI [address] [place], [place]  [address] Phone - [phone]  [facility] [address] [place], [place]  [address] Phone - [phone]   Fax - [phone]  [facility] [address] [place], [place]  [address] Phone - [phone]   Fax - [phone]  [facility] [address] [place], [place]  [address] Phone - [phone]   Fax - [phone]  [facility] [address] [place], [place]  [address] Phone - [phone]   Fax - [phone]   [facility] [address] [place], [place]  [address] Phone - [phone]   Fax - [phone]  [facility] [address] [place], [place]  [address] Phone - [phone]   Fax - [phone]  DAVID RUBIN [address] [place], [place]  [address] Phone - [phone]   Fax - [phone]  [facility] [address] [place], [place]  [address] Phone - [phone]   Fax - [phone]  [facility] [address] , [place]  [address] Phone - [phone]   Fax - [phone] [facility] [address] [place], [place]  [address] Phone - [phone]   Fax - [phone]  [facility] [address] [place], [place]  [address] Phone - [phone]   Fax - [phone]   [facility] [address] [place], [place]  [address] Phone - [phone]   Fax - [phone]  [facility] Charlene Flemming MD [address] [place] [address] Phone [phone]  Fax [phone]   Braxton Hicks Contractions Contractions of the uterus can occur throughout pregnancy, but they are   are not always a sign that you are in labor. You may have practice contractions called Braxton Hicks contractions. These false labor contractions are sometimes confused with true labor. What are Braxton Hicks contractions? Braxton Hicks contractions are tightening movements that occur in the muscles of the uterus before labor. Unlike true labor contractions, these contractions do not result in  opening (dilation) and thinning of the cervix. Toward the end of pregnancy (32-34 weeks), Braxton Hicks contractions can happen more often and may become stronger. These contractions are sometimes difficult to tell apart from true labor because they can be very uncomfortable. You should not feel embarrassed if you go to the hospital with false labor. Sometimes, the only way to tell if you are in true labor is for your health care provider to look for changes in the cervix. The health care provider will do a physical exam and may monitor your contractions. If you are not in true labor, the exam should show that your cervix is not dilating and your water has not broken. If there are other health problems associated with your pregnancy, it is completely safe for you to be sent home with false labor. You may continue to have Braxton Hicks contractions until you go into true labor. How to tell the difference between true labor and false labor True labor  Contractions last 30-70 seconds.  Contractions become very regular.  Discomfort is usually felt in the top of the uterus, and it spreads to the lower abdomen and low back.  Contractions do not go away with walking.  Contractions usually become more intense and increase in frequency.  The cervix dilates and gets thinner. False labor  Contractions are usually shorter and not as strong as true labor contractions.  Contractions are usually irregular.  Contractions are often felt in the front of the lower abdomen and in the groin.  Contractions may go away when you walk around or change positions while lying down.  Contractions get weaker and are shorter-lasting as time goes on.  The cervix usually does not dilate or become thin. Follow these instructions at home:  Take over-the-counter and prescription medicines only as told by your health care provider.  Keep up with your usual exercises and follow other instructions from your health care  provider.  Eat and drink lightly if you think you are going into labor.  If Braxton Hicks contractions are making you uncomfortable: ? Change your position from lying down or resting to walking, or change from walking to resting. ? Sit and rest in a tub of warm water. ? Drink enough fluid to keep your urine pale yellow. Dehydration may cause these contractions. ? Do slow and deep breathing several times an hour.  Keep all follow-up prenatal visits as told by your health care provider. This is important. Contact a health care provider if:  You have a fever.  You have continuous pain in your abdomen. Get help right away if:  Your contractions become stronger, more regular, and closer together.  You have fluid leaking or gushing from your vagina.  You pass blood-tinged mucus (bloody show).  You have bleeding from your vagina.  You have low back pain that you never had before.  You feel your baby's head pushing down and causing pelvic pressure.  Your baby is not moving inside you as much as it used to. Summary  Contractions that occur before labor are called Braxton Hicks contractions, false labor, or practice contractions.  Braxton Hicks contractions are   shorter, weaker, farther apart, and less regular than true labor contractions. True labor contractions usually become progressively stronger and regular and they become more frequent.  Manage discomfort from [facility] contractions by changing position, resting in a warm bath, drinking plenty of water, or practicing deep breathing. This information is not intended to replace advice given to you by your health care provider. Make sure you discuss any questions you have with your health care provider. Document Released: 12/25/2016 Document Revised: 12/25/2016 Document Reviewed: 12/25/2016 Elsevier Interactive Patient Education  2018 [employer].  Third Trimester of Pregnancy The third trimester is from week 28  through week 40 (months 7 through 9). The third trimester is a time when the unborn baby (fetus) is growing rapidly. At the end of the ninth month, the fetus is about 20 inches in length and weighs 6-10 pounds. Body changes during your third trimester Your body will continue to go through many changes during pregnancy. The changes vary from woman to woman. During the third trimester:  Your weight will continue to increase. You can expect to gain 25-35 pounds (11-16 kg) by the end of the pregnancy.  You may begin to get stretch marks on your hips, abdomen, and breasts.  You may urinate more often because the fetus is moving lower into your pelvis and pressing on your bladder.  You may develop or continue to have heartburn. This is caused by increased hormones that slow down muscles in the digestive tract.  You may develop or continue to have constipation because increased hormones slow digestion and cause the muscles that push waste through your intestines to relax.  You may develop hemorrhoids. These are swollen veins (varicose veins) in the rectum that can itch or be painful.  You may develop swollen, bulging veins (varicose veins) in your legs.  You may have increased body aches in the pelvis, back, or thighs. This is due to weight gain and increased hormones that are relaxing your joints.  You may have changes in your hair. These can include thickening of your hair, rapid growth, and changes in texture. Some women also have hair loss during or after pregnancy, or hair that feels dry or thin. Your hair will most likely return to normal after your baby is born.  Your breasts will continue to grow and they will continue to become tender. A yellow fluid (colostrum) may leak from your breasts. This is the first milk you are producing for your baby.  Your belly button may stick out.  You may notice more swelling in your hands, face, or ankles.  You may have increased tingling or numbness in  your hands, arms, and legs. The skin on your belly may also feel numb.  You may feel short of breath because of your expanding uterus.  You may have more problems sleeping. This can be caused by the size of your belly, increased need to urinate, and an increase in your body's metabolism.  You may notice the fetus "dropping," or moving lower in your abdomen (lightening).  You may have increased vaginal discharge.  You may notice your joints feel loose and you may have pain around your pelvic bone.  What to expect at prenatal visits You will have prenatal exams every 2 weeks until week 36. Then you will have weekly prenatal exams. During a routine prenatal visit:  You will be weighed to make sure you and the baby are growing normally.  Your blood pressure will be taken.  Your abdomen  will be measured to track your baby's growth.  The fetal heartbeat will be listened to.  Any test results from the previous visit will be discussed.  You may have a cervical check near your due date to see if your cervix has softened or thinned (effaced).  You will be tested for Group B streptococcus. This happens between 35 and 37 weeks.  Your health care provider may ask you:  What your birth plan is.  How you are feeling.  If you are feeling the baby move.  If you have had any abnormal symptoms, such as leaking fluid, bleeding, severe headaches, or abdominal cramping.  If you are using any tobacco products, including cigarettes, chewing tobacco, and electronic cigarettes.  If you have any questions.  Other tests or screenings that may be performed during your third trimester include:  Blood tests that check for low iron levels (anemia).  Fetal testing to check the health, activity level, and growth of the fetus. Testing is done if you have certain medical conditions or if there are problems during the pregnancy.  Nonstress test (NST). This test checks the health of your baby to make sure  there are no signs of problems, such as the baby not getting enough oxygen. During this test, a belt is placed around your belly. The baby is made to move, and its heart rate is monitored during movement.  What is false labor? False labor is a condition in which you feel small, irregular tightenings of the muscles in the womb (contractions) that usually go away with rest, changing position, or drinking water. These are called Braxton Hicks contractions. Contractions may last for hours, days, or even weeks before true labor sets in. If contractions come at regular intervals, become more frequent, increase in intensity, or become painful, you should see your health care provider. What are the signs of labor?  Abdominal cramps.  Regular contractions that start at 10 minutes apart and become stronger and more frequent with time.  Contractions that start on the top of the uterus and spread down to the lower abdomen and back.  Increased pelvic pressure and dull back pain.  A watery or bloody mucus discharge that comes from the vagina.  Leaking of amniotic fluid. This is also known as your "water breaking." It could be a slow trickle or a gush. Let your health care provider know if it has a color or strange odor. If you have any of these signs, call your health care provider right away, even if it is before your due date. Follow these instructions at home: Medicines  Follow your health care provider's instructions regarding medicine use. Specific medicines may be either safe or unsafe to take during pregnancy.  Take a prenatal vitamin that contains at least 600 micrograms (mcg) of folic acid.  If you develop constipation, try taking a stool softener if your health care provider approves. Eating and drinking  Eat a balanced diet that includes fresh fruits and vegetables, whole grains, good sources of protein such as meat, eggs, or tofu, and low-fat dairy. Your health care provider will help you  determine the amount of weight gain that is right for you.  Avoid raw meat and uncooked cheese. These carry germs that can cause birth defects in the baby.  If you have low calcium intake from food, talk to your health care provider about whether you should take a daily calcium supplement.  Eat four or five small meals rather than three large meals  a day.  Limit foods that are high in fat and processed sugars, such as fried and sweet foods.  To prevent constipation: ? Drink enough fluid to keep your urine clear or pale yellow. ? Eat foods that are high in fiber, such as fresh fruits and vegetables, whole grains, and beans. Activity  Exercise only as directed by your health care provider. Most women can continue their usual exercise routine during pregnancy. Try to exercise for 30 minutes at least 5 days a week. Stop exercising if you experience uterine contractions.  Avoid heavy lifting.  Do not exercise in extreme heat or humidity, or at high altitudes.  Wear low-heel, comfortable shoes.  Practice good posture.  You may continue to have sex unless your health care provider tells you otherwise. Relieving pain and discomfort  Take frequent breaks and rest with your legs elevated if you have leg cramps or low back pain.  Take warm sitz baths to soothe any pain or discomfort caused by hemorrhoids. Use hemorrhoid cream if your health care provider approves.  Wear a good support bra to prevent discomfort from breast tenderness.  If you develop varicose veins: ? Wear support pantyhose or compression stockings as told by your healthcare provider. ? Elevate your feet for 15 minutes, 3-4 times a day. Prenatal care  Write down your questions. Take them to your prenatal visits.  Keep all your prenatal visits as told by your health care provider. This is important. Safety  Wear your seat belt at all times when driving.  Make a list of emergency phone numbers, including numbers for  family, friends, the hospital, and police and fire departments. General instructions  Avoid cat litter boxes and soil used by cats. These carry germs that can cause birth defects in the baby. If you have a cat, ask someone to clean the litter box for you.  Do not travel far distances unless it is absolutely necessary and only with the approval of your health care provider.  Do not use hot tubs, steam rooms, or saunas.  Do not drink alcohol.  Do not use any products that contain nicotine or tobacco, such as cigarettes and e-cigarettes. If you need help quitting, ask your health care provider.  Do not use any medicinal herbs or unprescribed drugs. These chemicals affect the formation and growth of the baby.  Do not douche or use tampons or scented sanitary pads.  Do not cross your legs for long periods of time.  To prepare for the arrival of your baby: ? Take prenatal classes to understand, practice, and ask questions about labor and delivery. ? Make a trial run to the hospital. ? Visit the hospital and tour the maternity area. ? Arrange for maternity or paternity leave through employers. ? Arrange for family and friends to take care of pets while you are in the hospital. ? Purchase a rear-facing car seat and make sure you know how to install it in your car. ? Pack your hospital bag. ? Prepare the baby's nursery. Make sure to remove all pillows and stuffed animals from the baby's crib to prevent suffocation.  Visit your dentist if you have not gone during your pregnancy. Use a soft toothbrush to brush your teeth and be gentle when you floss. Contact a health care provider if:  You are unsure if you are in labor or if your water has broken.  You become dizzy.  You have mild pelvic cramps, pelvic pressure, or nagging pain in your abdominal area.  You have lower back pain.  You have persistent nausea, vomiting, or diarrhea.  You have an unusual or bad smelling vaginal  discharge.  You have pain when you urinate. Get help right away if:  Your water breaks before 37 weeks.  You have regular contractions less than 5 minutes apart before 37 weeks.  You have a fever.  You are leaking fluid from your vagina.  You have spotting or bleeding from your vagina.  You have severe abdominal pain or cramping.  You have rapid weight loss or weight gain.  You have shortness of breath with chest pain.  You notice sudden or extreme swelling of your face, hands, ankles, feet, or legs.  Your baby makes fewer than 10 movements in 2 hours.  You have severe headaches that do not go away when you take medicine.  You have vision changes. Summary  The third trimester is from week 28 through week 40, months 7 through 9. The third trimester is a time when the unborn baby (fetus) is growing rapidly.  During the third trimester, your discomfort may increase as you and your baby continue to gain weight. You may have abdominal, leg, and back pain, sleeping problems, and an increased need to urinate.  During the third trimester your breasts will keep growing and they will continue to become tender. A yellow fluid (colostrum) may leak from your breasts. This is the first milk you are producing for your baby.  False labor is a condition in which you feel small, irregular tightenings of the muscles in the womb (contractions) that eventually go away. These are called Braxton Hicks contractions. Contractions may last for hours, days, or even weeks before true labor sets in.  Signs of labor can include: abdominal cramps; regular contractions that start at 10 minutes apart and become stronger and more frequent with time; watery or bloody mucus discharge that comes from the vagina; increased pelvic pressure and dull back pain; and leaking of amniotic fluid. This information is not intended to replace advice given to you by your health care provider. Make sure you discuss any  questions you have with your health care provider. Document Released: 08/05/2001 Document Revised: 01/17/2016 Document Reviewed: 10/12/2012 Elsevier Interactive Patient Education  2017 [employer]  Vaginal Delivery Vaginal delivery means that you will give birth by pushing your baby out of your birth canal (vagina). A team of health care providers will help you before, during, and after vaginal delivery. Birth experiences are unique for every woman and every pregnancy, and birth experiences vary depending on where you choose to give birth. What should I do to prepare for my baby's birth? Before your baby is born, it is important to talk with your health care provider about:  Your labor and delivery preferences. These may include: ? Medicines that you may be given. ? How you will manage your pain. This might include non-medical pain relief techniques or injectable pain relief such as epidural analgesia. ? How you and your baby will be monitored during labor and delivery. ? Who may be in the labor and delivery room with you. ? Your feelings about surgical delivery of your baby (cesarean delivery, or C-section) if this becomes necessary. ? Your feelings about receiving donated blood through an IV tube (blood transfusion) if this becomes necessary.  Whether you are able: ? To take pictures or videos of the birth. ? To eat during labor and delivery. ? To move around, walk, or change positions during labor and  delivery.  What to expect after your baby is born, such as: ? Whether delayed umbilical cord clamping and cutting is offered. ? Who will care for your baby right after birth. ? Medicines or tests that may be recommended for your baby. ? Whether breastfeeding is supported in your hospital or birth center. ? How long you will be in the hospital or birth center.  How any medical conditions you have may affect your baby or your labor and delivery experience.  To prepare for your baby's  birth, you should also:  Attend all of your health care visits before delivery (prenatal visits) as recommended by your health care provider. This is important.  Prepare your home for your baby's arrival. Make sure that you have: ? Diapers. ? Baby clothing. ? Feeding equipment. ? Safe sleeping arrangements for you and your baby.  Install a car seat in your vehicle. Have your car seat checked by a certified car seat installer to make sure that it is installed safely.  Think about who will help you with your new baby at home for at least the first several weeks after delivery.  What can I expect when I arrive at the birth center or hospital? Once you are in labor and have been admitted into the hospital or birth center, your health care provider may:  Review your pregnancy history and any concerns you have.  Insert an IV tube into one of your veins. This is used to give you fluids and medicines.  Check your blood pressure, pulse, temperature, and heart rate (vital signs).  Check whether your bag of water (amniotic sac) has broken (ruptured).  Talk with you about your birth plan and discuss pain control options.  Monitoring Your health care provider may monitor your contractions (uterine monitoring) and your baby's heart rate (fetal monitoring). You may need to be monitored:  Often, but not continuously (intermittently).  All the time or for long periods at a time (continuously). Continuous monitoring may be needed if: ? You are taking certain medicines, such as medicine to relieve pain or make your contractions stronger. ? You have pregnancy or labor complications.  Monitoring may be done by:  Placing a special stethoscope or a handheld monitoring device on your abdomen to check your baby's heartbeat, and feeling your abdomen for contractions. This method of monitoring does not continuously record your baby's heartbeat or your contractions.  Placing monitors on your abdomen  (external monitors) to record your baby's heartbeat and the frequency and length of contractions. You may not have to wear external monitors all the time.  Placing monitors inside of your uterus (internal monitors) to record your baby's heartbeat and the frequency, length, and strength of your contractions. ? Your health care provider may use internal monitors if he or she needs more information about the strength of your contractions or your baby's heart rate. ? Internal monitors are put in place by passing a thin, flexible wire through your vagina and into your uterus. Depending on the type of monitor, it may remain in your uterus or on your baby's head until birth. ? Your health care provider will discuss the benefits and risks of internal monitoring with you and will ask for your permission before inserting the monitors.  Telemetry. This is a type of continuous monitoring that can be done with external or internal monitors. Instead of having to stay in bed, you are able to move around during telemetry. Ask your health care provider if telemetry is  an option for you.  Physical exam Your health care provider may perform a physical exam. This may include:  Checking whether your baby is positioned: ? With the head toward your vagina (head-down). This is most common. ? With the head toward the top of your uterus (head-up or breech). If your baby is in a breech position, your health care provider may try to turn your baby to a head-down position so you can deliver vaginally. If it does not seem that your baby can be born vaginally, your provider may recommend surgery to deliver your baby. In rare cases, you may be able to deliver vaginally if your baby is head-up (breech delivery). ? Lying sideways (transverse). Babies that are lying sideways cannot be delivered vaginally.  Checking your cervix to determine: ? Whether it is thinning out (effacing). ? Whether it is opening up (dilating). ? How low  your baby has moved into your birth canal.  What are the three stages of labor and delivery?  Normal labor and delivery is divided into the following three stages: Stage 1  Stage 1 is the longest stage of labor, and it can last for hours or days. Stage 1 includes: ? Early labor. This is when contractions may be irregular, or regular and mild. Generally, early labor contractions are more than 10 minutes apart. ? Active labor. This is when contractions get longer, more regular, more frequent, and more intense. ? The transition phase. This is when contractions happen very close together, are very intense, and may last longer than during any other part of labor.  Contractions generally feel mild, infrequent, and irregular at first. They get stronger, more frequent (about every 2-3 minutes), and more regular as you progress from early labor through active labor and transition.  Many women progress through stage 1 naturally, but you may need help to continue making progress. If this happens, your health care provider may talk with you about: ? Rupturing your amniotic sac if it has not ruptured yet. ? Giving you medicine to help make your contractions stronger and more frequent.  Stage 1 ends when your cervix is completely dilated to 4 inches (10 cm) and completely effaced. This happens at the end of the transition phase. Stage 2  Once your cervix is completely effaced and dilated to 4 inches (10 cm), you may start to feel an urge to push. It is common for the body to naturally take a rest before feeling the urge to push, especially if you received an epidural or certain other pain medicines. This rest period may last for up to 1-2 hours, depending on your unique labor experience.  During stage 2, contractions are generally less painful, because pushing helps relieve contraction pain. Instead of contraction pain, you may feel stretching and burning pain, especially when the widest part of your baby's  head passes through the vaginal opening (crowning).  Your health care provider will closely monitor your pushing progress and your baby's progress through the vagina during stage 2.  Your health care provider may massage the area of skin between your vaginal opening and anus (perineum) or apply warm compresses to your perineum. This helps it stretch as the baby's head starts to crown, which can help prevent perineal tearing. ? In some cases, an incision may be made in your perineum (episiotomy) to allow the baby to pass through the vaginal opening. An episiotomy helps to make the opening of the vagina larger to allow more room for the baby to  fit through.  It is very important to breathe and focus so your health care provider can control the delivery of your baby's head. Your health care provider may have you decrease the intensity of your pushing, to help prevent perineal tearing.  After delivery of your baby's head, the shoulders and the rest of the body generally deliver very quickly and without difficulty.  Once your baby is delivered, the umbilical cord may be cut right away, or this may be delayed for 1-2 minutes, depending on your baby's health. This may vary among health care providers, hospitals, and birth centers.  If you and your baby are healthy enough, your baby may be placed on your chest or abdomen to help maintain the baby's temperature and to help you bond with each other. Some mothers and babies start breastfeeding at this time. Your health care team will dry your baby and help keep your baby warm during this time.  Your baby may need immediate care if he or she: ? Showed signs of distress during labor. ? Has a medical condition. ? Was born too early (prematurely). ? Had a bowel movement before birth (meconium). ? Shows signs of difficulty transitioning from being inside the uterus to being outside of the uterus. If you are planning to breastfeed, your health care team will  help you begin a feeding. Stage 3  The third stage of labor starts immediately after the birth of your baby and ends after you deliver the placenta. The placenta is an organ that develops during pregnancy to provide oxygen and nutrients to your baby in the womb.  Delivering the placenta may require some pushing, and you may have mild contractions. Breastfeeding can stimulate contractions to help you deliver the placenta.  After the placenta is delivered, your uterus should tighten (contract) and become firm. This helps to stop bleeding in your uterus. To help your uterus contract and to control bleeding, your health care provider may: ? Give you medicine by injection, through an IV tube, by mouth, or through your rectum (rectally). ? Massage your abdomen or perform a vaginal exam to remove any blood clots that are left in your uterus. ? Empty your bladder by placing a thin, flexible tube (catheter) into your bladder. ? Encourage you to breastfeed your baby. After labor is over, you and your baby will be monitored closely to ensure that you are both healthy until you are ready to go home. Your health care team will teach you how to care for yourself and your baby. This information is not intended to replace advice given to you by your health care provider. Make sure you discuss any questions you have with your health care provider. Document Released: 05/20/2008 Document Revised: 02/29/2016 Document Reviewed: 08/26/2015 Elsevier Interactive Patient Education  2018 [employer].

## 2018-01-26 NOTE — Addendum Note (Signed)
Addended by: Orvilla CornwallENNEY, Haydon Dorris A on: 01/26/2018 10:10 AM   Modules accepted: Orders

## 2018-01-26 NOTE — Progress Notes (Addendum)
   PRENATAL VISIT NOTE  Subjective:  Gerarda GuntherMaerena Nonaka is a 27 y.o. G2P0010 at 4139w4d being seen today for ongoing prenatal care.  She is currently monitored for the following issues for this low-risk pregnancy and has Diverticulitis; Hx of iron deficiency anemia; Incomplete abortion; Microcytic anemia; Supervision of other normal pregnancy, antepartum; Vitamin D deficiency; Elevated blood pressure affecting pregnancy in second trimester, antepartum; and Hemorrhoids during pregnancy in third trimester on their problem list.  Patient reports no contractions, no cramping, no leaking and spotting when wipping on Sunday X2 light pink, no LOF.  Contractions: Irritability. Vag. Bleeding: Other.  Movement: Present. Denies leaking of fluid.   The following portions of the patient's history were reviewed and updated as appropriate: allergies, current medications, past family history, past medical history, past social history, past surgical history and problem list. Problem list updated.  Objective:   Vitals:   01/26/18 0944  BP: 123/75  Pulse: 95  Weight: 209 lb 4.8 oz (94.9 kg)    Fetal Status: Fetal Heart Rate (bpm): 135-155; doppler Fundal Height: 33 cm Movement: Present  Presentation: Vertex  General:  Alert, oriented and cooperative. Patient is in no acute distress.  Skin: Skin is warm and dry. No rash noted.   Cardiovascular: Normal heart rate noted  Respiratory: Normal respiratory effort, no problems with respiration noted  Abdomen: Soft, gravid, appropriate for gestational age.  Pain/Pressure: Present     Pelvic: Cervical exam performed Dilation: Closed Effacement (%): Thick Station: -3  Extremities: Normal range of motion.  Edema: Trace  Mental Status: Normal mood and affect. Normal behavior. Normal judgment and thought content.   Assessment and Plan:  Pregnancy: G2P0010 at 3239w4d  1. Supervision of other normal pregnancy, antepartum        2. Vitamin D deficiency      Taking weekly  vitamin D  3. Hemorrhoids during pregnancy in third trimester     Most likely related constipation, OTC Colace discussed.  - hydrocortisone (ANUSOL-HC) 25 MG suppository; Place 1 suppository (25 mg total) rectally 2 (two) times daily.  Dispense: 12 suppository; Refill: 4  4. Spotting affecting pregnancy in third trimester     Most likely r/t hemorrhoids - Cervicovaginal ancillary only  Preterm labor symptoms and general obstetric precautions including but not limited to vaginal bleeding, contractions, leaking of fluid and fetal movement were reviewed in detail with the patient. Please refer to After Visit Summary for other counseling recommendations.  Return in about 2 weeks (around 02/09/2018) for ROB, GBS.  No future appointments.  Roe Coombsachelle A Jayleena Stille, CNM

## 2018-01-26 NOTE — Progress Notes (Signed)
ROB.  C/o spotting on Sunday, light pink when she wiped.

## 2018-01-27 LAB — CERVICOVAGINAL ANCILLARY ONLY
BACTERIAL VAGINITIS: POSITIVE — AB
CHLAMYDIA, DNA PROBE: NEGATIVE
Candida vaginitis: NEGATIVE
Neisseria Gonorrhea: NEGATIVE
Trichomonas: NEGATIVE

## 2018-01-28 ENCOUNTER — Telehealth: Payer: Self-pay | Admitting: *Deleted

## 2018-01-28 NOTE — Telephone Encounter (Signed)
Call placed to pt to discuss recent Rx- Anusol. Pt made aware that Medicaid will not cover this Rx at all. Pt advised to [facility] Pt advised run Rx through her [employer] to determine coverage. If to expensive, pt made aware she may try OTC tx.  Pt advised to make office aware if she has any other needs.

## 2018-01-29 ENCOUNTER — Other Ambulatory Visit: Payer: Self-pay | Admitting: Certified Nurse Midwife

## 2018-01-29 DIAGNOSIS — N760 Acute vaginitis: Principal | ICD-10-CM

## 2018-01-29 DIAGNOSIS — B9689 Other specified bacterial agents as the cause of diseases classified elsewhere: Secondary | ICD-10-CM

## 2018-01-29 MED ORDER — TINIDAZOLE 500 MG PO TABS: 2.0000 g | ORAL_TABLET | Freq: Every day | ORAL | 0 refills | Status: DC

## 2018-02-02 ENCOUNTER — Other Ambulatory Visit: Payer: Self-pay

## 2018-02-02 ENCOUNTER — Encounter: Payer: Self-pay | Admitting: Certified Nurse Midwife

## 2018-02-02 DIAGNOSIS — B9689 Other specified bacterial agents as the cause of diseases classified elsewhere: Secondary | ICD-10-CM

## 2018-02-02 DIAGNOSIS — N760 Acute vaginitis: Principal | ICD-10-CM

## 2018-02-02 MED ORDER — METRONIDAZOLE 500 MG PO TABS: 500.0000 mg | ORAL_TABLET | Freq: Two times a day (BID) | ORAL | 0 refills | Status: DC

## 2018-02-02 NOTE — Progress Notes (Signed)
Tinidazole is unavailable with pharmacy (on back order), changed prescription to flagyl 7 days per R. Denney. Pt notified

## 2018-02-09 ENCOUNTER — Encounter: Payer: Self-pay | Admitting: Certified Nurse Midwife

## 2018-02-09 ENCOUNTER — Ambulatory Visit (INDEPENDENT_AMBULATORY_CARE_PROVIDER_SITE_OTHER): Payer: 59 | Admitting: Certified Nurse Midwife

## 2018-02-09 VITALS — BP 117/73 | HR 97 | Wt 208.0 lb

## 2018-02-09 DIAGNOSIS — Z3483 Encounter for supervision of other normal pregnancy, third trimester: Secondary | ICD-10-CM

## 2018-02-09 DIAGNOSIS — Z3480 Encounter for supervision of other normal pregnancy, unspecified trimester: Secondary | ICD-10-CM

## 2018-02-09 DIAGNOSIS — E559 Vitamin D deficiency, unspecified: Secondary | ICD-10-CM

## 2018-02-09 NOTE — Progress Notes (Signed)
   PRENATAL VISIT NOTE  Subjective:  Pamela Brandt is a 27 y.o. G2P0010 at 5262w4d being seen today for ongoing prenatal care.  She is currently monitored for the following issues for this low-risk pregnancy and has Diverticulitis; Hx of iron deficiency anemia; Incomplete abortion; Microcytic anemia; Supervision of other normal pregnancy, antepartum; Vitamin D deficiency; Elevated blood pressure affecting pregnancy in second trimester, antepartum; and Hemorrhoids during pregnancy in third trimester on their problem list.  Patient reports no bleeding, no contractions, no cramping and no leaking.  Contractions: Irregular. Vag. Bleeding: None.  Movement: Present. Denies leaking of fluid.   The following portions of the patient's history were reviewed and updated as appropriate: allergies, current medications, past family history, past medical history, past social history, past surgical history and problem list. Problem list updated.  Objective:   Vitals:   02/09/18 0851  BP: 117/73  Pulse: 97  Weight: 208 lb (94.3 kg)    Fetal Status: Fetal Heart Rate (bpm): 147; doppler Fundal Height: 36 cm Movement: Present  Presentation: Vertex  General:  Alert, oriented and cooperative. Patient is in no acute distress.  Skin: Skin is warm and dry. No rash noted.   Cardiovascular: Normal heart rate noted  Respiratory: Normal respiratory effort, no problems with respiration noted  Abdomen: Soft, gravid, appropriate for gestational age.  Pain/Pressure: Present     Pelvic: Cervical exam performed Dilation: Closed Effacement (%): Thick Station: -3; posterior  Extremities: Normal range of motion.     Mental Status: Normal mood and affect. Normal behavior. Normal judgment and thought content.   Assessment and Plan:  Pregnancy: G2P0010 at 6462w4d  1. Supervision of other normal pregnancy, antepartum     Doing well - Strep Gp B NAA  2. Vitamin D deficiency     Taking weekly vitamin D  Preterm labor  symptoms and general obstetric precautions including but not limited to vaginal bleeding, contractions, leaking of fluid and fetal movement were reviewed in detail with the patient. Please refer to After Visit Summary for other counseling recommendations.  Return in about 1 week (around 02/16/2018) for ROB.  Future Appointments  Date Time Provider Department Center  02/16/2018  8:45 AM Roe Coombsenney, Cherilyn Sautter A, CNM [facility] None    Roe Coombsachelle A Redding Cloe, CNM

## 2018-02-11 LAB — STREP GP B NAA: Strep Gp B NAA: POSITIVE — AB

## 2018-02-12 ENCOUNTER — Other Ambulatory Visit: Payer: Self-pay | Admitting: Certified Nurse Midwife

## 2018-02-12 DIAGNOSIS — B951 Streptococcus, group B, as the cause of diseases classified elsewhere: Secondary | ICD-10-CM

## 2018-02-12 DIAGNOSIS — Z3480 Encounter for supervision of other normal pregnancy, unspecified trimester: Secondary | ICD-10-CM

## 2018-02-16 ENCOUNTER — Encounter: Payer: Self-pay | Admitting: Certified Nurse Midwife

## 2018-02-16 ENCOUNTER — Ambulatory Visit (INDEPENDENT_AMBULATORY_CARE_PROVIDER_SITE_OTHER): Payer: 59 | Admitting: Certified Nurse Midwife

## 2018-02-16 VITALS — BP 117/78 | HR 92 | Wt 208.4 lb

## 2018-02-16 DIAGNOSIS — B951 Streptococcus, group B, as the cause of diseases classified elsewhere: Secondary | ICD-10-CM

## 2018-02-16 DIAGNOSIS — Z3483 Encounter for supervision of other normal pregnancy, third trimester: Secondary | ICD-10-CM

## 2018-02-16 DIAGNOSIS — Z3480 Encounter for supervision of other normal pregnancy, unspecified trimester: Secondary | ICD-10-CM

## 2018-02-16 DIAGNOSIS — E559 Vitamin D deficiency, unspecified: Secondary | ICD-10-CM

## 2018-02-16 NOTE — Patient Instructions (Signed)
Before [facility] Before your baby arrives it is important to:  Have all of the supplies that you will need to care for your baby.  Know where to go if there is an emergency.  Discuss the baby's arrival with other family members.  What supplies will I need?  It is recommended that you have the following supplies: Large Items  Crib.  Crib mattress.  Rear-facing infant car seat. If possible, have a trained professional check to make sure that it is installed correctly.  Feeding  6-8 bottles that are 4-5 oz in size.  6-8 nipples.  Bottle brush.  Sterilizer, or a large pan or kettle with a lid.  A way to boil and cool water.  If you will be breastfeeding: ? Breast pump. ? Nipple cream. ? Nursing bra. ? Breast pads. ? Breast shields.  If you will be formula feeding: ? Formula. ? Measuring cups. ? Measuring spoons.  Bathing  Mild baby soap and baby shampoo.  Petroleum jelly.  Soft cloth towel and washcloth.  Hooded towel.  Cotton balls.  Bath basin.  Other Supplies  Rectal thermometer.  Bulb syringe.  Baby wipes or washcloths for diaper changes.  Diaper bag.  Changing pad.  Clothing, including one-piece outfits and pajamas.  Baby nail clippers.  Receiving blankets.  Mattress pad and sheets for the crib.  Night-light for the baby's room.  Baby monitor.  2 or 3 pacifiers.  Either 24-36 cloth diapers and waterproof diaper covers or a box of disposable diapers. You may need to use as many as 10-12 diapers per day.  How do I prepare for an emergency? Prepare for an emergency by:  Knowing how to get to the nearest hospital.  Listing the phone numbers of your baby's health care providers near your home phone and in your cell phone.  How do I prepare my family?  Decide how to handle visitors.  If you have other children: ? Talk with them about the baby coming home. Ask them how they feel about it. ? Read a book together about  being a new big brother or sister. ? Find ways to let them help you prepare for the new baby. ? Have someone ready to care for them while you are in the hospital. This information is not intended to replace advice given to you by your health care provider. Make sure you discuss any questions you have with your health care provider. Document Released: 07/24/2008 Document Revised: 01/17/2016 Document Reviewed: 07/19/2014 Elsevier Interactive Patient Education  2018 [employer].  Third Trimester of Pregnancy The third trimester is from week 28 through week 40 (months 7 through 9). The third trimester is a time when the unborn baby (fetus) is growing rapidly. At the end of the ninth month, the fetus is about 20 inches in length and weighs 6-10 pounds. Body changes during your third trimester Your body will continue to go through many changes during pregnancy. The changes vary from woman to woman. During the third trimester:  Your weight will continue to increase. You can expect to gain 25-35 pounds (11-16 kg) by the end of the pregnancy.  You may begin to get stretch marks on your hips, abdomen, and breasts.  You may urinate more often because the fetus is moving lower into your pelvis and pressing on your bladder.  You may develop or continue to have heartburn. This is caused by increased hormones that slow down muscles in the digestive tract.  You may  develop or continue to have constipation because increased hormones slow digestion and cause the muscles that push waste through your intestines to relax.  You may develop hemorrhoids. These are swollen veins (varicose veins) in the rectum that can itch or be painful.  You may develop swollen, bulging veins (varicose veins) in your legs.  You may have increased body aches in the pelvis, back, or thighs. This is due to weight gain and increased hormones that are relaxing your joints.  You may have changes in your hair. These can include  thickening of your hair, rapid growth, and changes in texture. Some women also have hair loss during or after pregnancy, or hair that feels dry or thin. Your hair will most likely return to normal after your baby is born.  Your breasts will continue to grow and they will continue to become tender. A yellow fluid (colostrum) may leak from your breasts. This is the first milk you are producing for your baby.  Your belly button may stick out.  You may notice more swelling in your hands, face, or ankles.  You may have increased tingling or numbness in your hands, arms, and legs. The skin on your belly may also feel numb.  You may feel short of breath because of your expanding uterus.  You may have more problems sleeping. This can be caused by the size of your belly, increased need to urinate, and an increase in your body's metabolism.  You may notice the fetus "dropping," or moving lower in your abdomen (lightening).  You may have increased vaginal discharge.  You may notice your joints feel loose and you may have pain around your pelvic bone.  What to expect at prenatal visits You will have prenatal exams every 2 weeks until week 36. Then you will have weekly prenatal exams. During a routine prenatal visit:  You will be weighed to make sure you and the baby are growing normally.  Your blood pressure will be taken.  Your abdomen will be measured to track your baby's growth.  The fetal heartbeat will be listened to.  Any test results from the previous visit will be discussed.  You may have a cervical check near your due date to see if your cervix has softened or thinned (effaced).  You will be tested for Group B streptococcus. This happens between 35 and 37 weeks.  Your health care provider may ask you:  What your birth plan is.  How you are feeling.  If you are feeling the baby move.  If you have had any abnormal symptoms, such as leaking fluid, bleeding, severe headaches, or  abdominal cramping.  If you are using any tobacco products, including cigarettes, chewing tobacco, and electronic cigarettes.  If you have any questions.  Other tests or screenings that may be performed during your third trimester include:  Blood tests that check for low iron levels (anemia).  Fetal testing to check the health, activity level, and growth of the fetus. Testing is done if you have certain medical conditions or if there are problems during the pregnancy.  Nonstress test (NST). This test checks the health of your baby to make sure there are no signs of problems, such as the baby not getting enough oxygen. During this test, a belt is placed around your belly. The baby is made to move, and its heart rate is monitored during movement.  What is false labor? False labor is a condition in which you feel small, irregular tightenings of  the muscles in the womb (contractions) that usually go away with rest, changing position, or drinking water. These are called Braxton Hicks contractions. Contractions may last for hours, days, or even weeks before true labor sets in. If contractions come at regular intervals, become more frequent, increase in intensity, or become painful, you should see your health care provider. What are the signs of labor?  Abdominal cramps.  Regular contractions that start at 10 minutes apart and become stronger and more frequent with time.  Contractions that start on the top of the uterus and spread down to the lower abdomen and back.  Increased pelvic pressure and dull back pain.  A watery or bloody mucus discharge that comes from the vagina.  Leaking of amniotic fluid. This is also known as your "water breaking." It could be a slow trickle or a gush. Let your health care provider know if it has a color or strange odor. If you have any of these signs, call your health care provider right away, even if it is before your due date. Follow these instructions at  home: Medicines  Follow your health care provider's instructions regarding medicine use. Specific medicines may be either safe or unsafe to take during pregnancy.  Take a prenatal vitamin that contains at least 600 micrograms (mcg) of folic acid.  If you develop constipation, try taking a stool softener if your health care provider approves. Eating and drinking  Eat a balanced diet that includes fresh fruits and vegetables, whole grains, good sources of protein such as meat, eggs, or tofu, and low-fat dairy. Your health care provider will help you determine the amount of weight gain that is right for you.  Avoid raw meat and uncooked cheese. These carry germs that can cause birth defects in the baby.  If you have low calcium intake from food, talk to your health care provider about whether you should take a daily calcium supplement.  Eat four or five small meals rather than three large meals a day.  Limit foods that are high in fat and processed sugars, such as fried and sweet foods.  To prevent constipation: ? Drink enough fluid to keep your urine clear or pale yellow. ? Eat foods that are high in fiber, such as fresh fruits and vegetables, whole grains, and beans. Activity  Exercise only as directed by your health care provider. Most women can continue their usual exercise routine during pregnancy. Try to exercise for 30 minutes at least 5 days a week. Stop exercising if you experience uterine contractions.  Avoid heavy lifting.  Do not exercise in extreme heat or humidity, or at high altitudes.  Wear low-heel, comfortable shoes.  Practice good posture.  You may continue to have sex unless your health care provider tells you otherwise. Relieving pain and discomfort  Take frequent breaks and rest with your legs elevated if you have leg cramps or low back pain.  Take warm sitz baths to soothe any pain or discomfort caused by hemorrhoids. Use hemorrhoid cream if your health  care provider approves.  Wear a good support bra to prevent discomfort from breast tenderness.  If you develop varicose veins: ? Wear support pantyhose or compression stockings as told by your healthcare provider. ? Elevate your feet for 15 minutes, 3-4 times a day. Prenatal care  Write down your questions. Take them to your prenatal visits.  Keep all your prenatal visits as told by your health care provider. This is important. Safety  Wear your seat  belt at all times when driving.  Make a list of emergency phone numbers, including numbers for family, friends, the hospital, and police and fire departments. General instructions  Avoid cat litter boxes and soil used by cats. These carry germs that can cause birth defects in the baby. If you have a cat, ask someone to clean the litter box for you.  Do not travel far distances unless it is absolutely necessary and only with the approval of your health care provider.  Do not use hot tubs, steam rooms, or saunas.  Do not drink alcohol.  Do not use any products that contain nicotine or tobacco, such as cigarettes and e-cigarettes. If you need help quitting, ask your health care provider.  Do not use any medicinal herbs or unprescribed drugs. These chemicals affect the formation and growth of the baby.  Do not douche or use tampons or scented sanitary pads.  Do not cross your legs for long periods of time.  To prepare for the arrival of your baby: ? Take prenatal classes to understand, practice, and ask questions about labor and delivery. ? Make a trial run to the hospital. ? Visit the hospital and tour the maternity area. ? Arrange for maternity or paternity leave through employers. ? Arrange for family and friends to take care of pets while you are in the hospital. ? Purchase a rear-facing car seat and make sure you know how to install it in your car. ? Pack your hospital bag. ? Prepare the baby's nursery. Make sure to remove all  pillows and stuffed animals from the baby's crib to prevent suffocation.  Visit your dentist if you have not gone during your pregnancy. Use a soft toothbrush to brush your teeth and be gentle when you floss. Contact a health care provider if:  You are unsure if you are in labor or if your water has broken.  You become dizzy.  You have mild pelvic cramps, pelvic pressure, or nagging pain in your abdominal area.  You have lower back pain.  You have persistent nausea, vomiting, or diarrhea.  You have an unusual or bad smelling vaginal discharge.  You have pain when you urinate. Get help right away if:  Your water breaks before 37 weeks.  You have regular contractions less than 5 minutes apart before 37 weeks.  You have a fever.  You are leaking fluid from your vagina.  You have spotting or bleeding from your vagina.  You have severe abdominal pain or cramping.  You have rapid weight loss or weight gain.  You have shortness of breath with chest pain.  You notice sudden or extreme swelling of your face, hands, ankles, feet, or legs.  Your baby makes fewer than 10 movements in 2 hours.  You have severe headaches that do not go away when you take medicine.  You have vision changes. Summary  The third trimester is from week 28 through week 40, months 7 through 9. The third trimester is a time when the unborn baby (fetus) is growing rapidly.  During the third trimester, your discomfort may increase as you and your baby continue to gain weight. You may have abdominal, leg, and back pain, sleeping problems, and an increased need to urinate.  During the third trimester your breasts will keep growing and they will continue to become tender. A yellow fluid (colostrum) may leak from your breasts. This is the first milk you are producing for your baby.  False labor is a condition  in which you feel small, irregular tightenings of the muscles in the womb (contractions) that  eventually go away. These are called Braxton Hicks contractions. Contractions may last for hours, days, or even weeks before true labor sets in.  Signs of labor can include: abdominal cramps; regular contractions that start at 10 minutes apart and become stronger and more frequent with time; watery or bloody mucus discharge that comes from the vagina; increased pelvic pressure and dull back pain; and leaking of amniotic fluid. This information is not intended to replace advice given to you by your health care provider. Make sure you discuss any questions you have with your health care provider. Document Released: 08/05/2001 Document Revised: 01/17/2016 Document Reviewed: 10/12/2012 Elsevier Interactive Patient Education  2017 [employer]

## 2018-02-16 NOTE — Progress Notes (Signed)
   PRENATAL VISIT NOTE  Subjective:  Pamela Brandt is a 27 y.o. G2P0010 at 8157w4d being seen today for ongoing prenatal care.  She is currently monitored for the following issues for this low-risk pregnancy and has Diverticulitis; Hx of iron deficiency anemia; Incomplete abortion; Microcytic anemia; Supervision of other normal pregnancy, antepartum; Vitamin D deficiency; Elevated blood pressure affecting pregnancy in second trimester, antepartum; Hemorrhoids during pregnancy in third trimester; and Positive GBS test on their problem list.  Patient reports no complaints.  Contractions: Irritability. Vag. Bleeding: None.  Movement: Present. Denies leaking of fluid.   The following portions of the patient's history were reviewed and updated as appropriate: allergies, current medications, past family history, past medical history, past social history, past surgical history and problem list. Problem list updated.  Objective:   Vitals:   02/16/18 0847  BP: 117/78  Pulse: 92  Weight: 208 lb 6.4 oz (94.5 kg)    Fetal Status: Fetal Heart Rate (bpm): 148; doppler Fundal Height: 36 cm Movement: Present     General:  Alert, oriented and cooperative. Patient is in no acute distress.  Skin: Skin is warm and dry. No rash noted.   Cardiovascular: Normal heart rate noted  Respiratory: Normal respiratory effort, no problems with respiration noted  Abdomen: Soft, gravid, appropriate for gestational age.  Pain/Pressure: Present     Pelvic: Cervical exam deferred        Extremities: Normal range of motion.  Edema: Trace  Mental Status: Normal mood and affect. Normal behavior. Normal judgment and thought content.   Assessment and Plan:  Pregnancy: G2P0010 at 3457w4d  1. Supervision of other normal pregnancy, antepartum     Doing well  2. Positive GBS test     PCN for labor/delivery  3. Vitamin D deficiency     Taking weekly vitamin D  Preterm labor symptoms and general obstetric precautions  including but not limited to vaginal bleeding, contractions, leaking of fluid and fetal movement were reviewed in detail with the patient. Please refer to After Visit Summary for other counseling recommendations.  Return in about 1 week (around 02/23/2018) for ROB.  Future Appointments  Date Time Provider Department Center  02/23/2018  8:45 AM Roe Coombsenney, Kiira Brach A, CNM [facility] None  03/02/2018  9:00 AM Roe Coombsenney, Menucha Dicesare A, CNM [facility] None    Roe Coombsachelle A Lucas Winograd, CNM

## 2018-02-23 ENCOUNTER — Encounter: Payer: Self-pay | Admitting: Certified Nurse Midwife

## 2018-02-23 ENCOUNTER — Ambulatory Visit (INDEPENDENT_AMBULATORY_CARE_PROVIDER_SITE_OTHER): Payer: 59 | Admitting: Certified Nurse Midwife

## 2018-02-23 VITALS — BP 124/75 | HR 94 | Wt 209.4 lb

## 2018-02-23 DIAGNOSIS — E559 Vitamin D deficiency, unspecified: Secondary | ICD-10-CM

## 2018-02-23 DIAGNOSIS — B951 Streptococcus, group B, as the cause of diseases classified elsewhere: Secondary | ICD-10-CM

## 2018-02-23 DIAGNOSIS — Z3480 Encounter for supervision of other normal pregnancy, unspecified trimester: Secondary | ICD-10-CM

## 2018-02-23 NOTE — Progress Notes (Signed)
   PRENATAL VISIT NOTE  Subjective:  Pamela Brandt is a 27 y.o. G2P0010 at 2076w4d being seen today for ongoing prenatal care.  She is currently monitored for the following issues for this low-risk pregnancy and has Diverticulitis; Hx of iron deficiency anemia; Incomplete abortion; Microcytic anemia; Supervision of other normal pregnancy, antepartum; Vitamin D deficiency; Elevated blood pressure affecting pregnancy in second trimester, antepartum; Hemorrhoids during pregnancy in third trimester; and Positive GBS test on their problem list.  Patient reports no complaints.  Contractions: Irritability. Vag. Bleeding: None.  Movement: Present. Denies leaking of fluid.   The following portions of the patient's history were reviewed and updated as appropriate: allergies, current medications, past family history, past medical history, past social history, past surgical history and problem list. Problem list updated.  Objective:   Vitals:   02/23/18 0852  BP: 124/75  Pulse: 94  Weight: 209 lb 6.4 oz (95 kg)    Fetal Status: Fetal Heart Rate (bpm): 144; doppler Fundal Height: 37 cm Movement: Present     General:  Alert, oriented and cooperative. Patient is in no acute distress.  Skin: Skin is warm and dry. No rash noted.   Cardiovascular: Normal heart rate noted  Respiratory: Normal respiratory effort, no problems with respiration noted  Abdomen: Soft, gravid, appropriate for gestational age.  Pain/Pressure: Present     Pelvic: Cervical exam deferred        Extremities: Normal range of motion.  Edema: Trace  Mental Status: Normal mood and affect. Normal behavior. Normal judgment and thought content.   Assessment and Plan:  Pregnancy: G2P0010 at 9176w4d  1. Supervision of other normal pregnancy, antepartum      Doing well  2. Vitamin D deficiency     Taking weekly vitamin D  3. Positive GBS test     PCN for labor/delivery  Term labor symptoms and general obstetric precautions including  but not limited to vaginal bleeding, contractions, leaking of fluid and fetal movement were reviewed in detail with the patient. Please refer to After Visit Summary for other counseling recommendations.  Return in about 1 week (around 03/02/2018) for ROB.  Future Appointments  Date Time Provider Department Center  03/02/2018  9:00 AM Roe Coombsenney, Maeley Matton A, CNM [facility] None    Roe Coombsachelle A Kalib Bhagat, CNM

## 2018-02-27 ENCOUNTER — Encounter: Payer: Self-pay | Admitting: Certified Nurse Midwife

## 2018-03-01 ENCOUNTER — Encounter: Payer: Self-pay | Admitting: Certified Nurse Midwife

## 2018-03-02 ENCOUNTER — Ambulatory Visit (INDEPENDENT_AMBULATORY_CARE_PROVIDER_SITE_OTHER): Payer: 59 | Admitting: Certified Nurse Midwife

## 2018-03-02 VITALS — BP 124/77 | HR 99 | Wt 210.0 lb

## 2018-03-02 DIAGNOSIS — Z3480 Encounter for supervision of other normal pregnancy, unspecified trimester: Secondary | ICD-10-CM

## 2018-03-02 DIAGNOSIS — E559 Vitamin D deficiency, unspecified: Secondary | ICD-10-CM

## 2018-03-02 DIAGNOSIS — Z3481 Encounter for supervision of other normal pregnancy, first trimester: Secondary | ICD-10-CM

## 2018-03-02 DIAGNOSIS — B951 Streptococcus, group B, as the cause of diseases classified elsewhere: Secondary | ICD-10-CM

## 2018-03-02 NOTE — Progress Notes (Signed)
   PRENATAL VISIT NOTE  Subjective:  Pamela Brandt is a 27 y.o. G2P0010 at 8275w4d being seen today for ongoing prenatal care.  She is currently monitored for the following issues for this low-risk pregnancy and has Diverticulitis; Hx of iron deficiency anemia; Incomplete abortion; Microcytic anemia; Supervision of other normal pregnancy, antepartum; Vitamin D deficiency; Elevated blood pressure affecting pregnancy in second trimester, antepartum; Hemorrhoids during pregnancy in third trimester; and Positive GBS test on their problem list.  Patient reports no bleeding, no leaking and occasional contractions.  Contractions: Irregular. Vag. Bleeding: None.  Movement: Present. Denies leaking of fluid.   The following portions of the patient's history were reviewed and updated as appropriate: allergies, current medications, past family history, past medical history, past social history, past surgical history and problem list. Problem list updated.  Objective:   Vitals:   03/02/18 0859  BP: 124/77  Pulse: 99  Weight: 210 lb (95.3 kg)    Fetal Status: Fetal Heart Rate (bpm): 140; doppler Fundal Height: 39 cm Movement: Present  Presentation: Vertex  General:  Alert, oriented and cooperative. Patient is in no acute distress.  Skin: Skin is warm and dry. No rash noted.   Cardiovascular: Normal heart rate noted  Respiratory: Normal respiratory effort, no problems with respiration noted  Abdomen: Soft, gravid, appropriate for gestational age.  Pain/Pressure: Present     Pelvic: Cervical exam performed Dilation: Closed Effacement (%): Thick Station: -3  Extremities: Normal range of motion.     Mental Status: Normal mood and affect. Normal behavior. Normal judgment and thought content.   Assessment and Plan:  Pregnancy: G2P0010 at 275w4d  1. Supervision of other normal pregnancy, antepartum     Doing well  2. Vitamin D deficiency     Taking weekly vitamin D  3. Positive GBS test     PCN for  labor/delivery  Term labor symptoms and general obstetric precautions including but not limited to vaginal bleeding, contractions, leaking of fluid and fetal movement were reviewed in detail with the patient. Please refer to After Visit Summary for other counseling recommendations.  Return in about 1 week (around 03/09/2018) for ROB.  No future appointments.  Roe Coombsachelle A Glada Wickstrom, CNM

## 2018-03-02 NOTE — Progress Notes (Signed)
Pt states she is having increase in pelvic pressure, would like cervix check today. Handout given for Tdap in pregnancy.

## 2018-03-07 ENCOUNTER — Inpatient Hospital Stay (HOSPITAL_COMMUNITY)
Admission: AD | Admit: 2018-03-07 | Discharge: 2018-03-07 | Disposition: A | Discharge status: Home / Self Care | Payer: 59 | Source: Ambulatory Visit | Attending: Obstetrics & Gynecology | Admitting: Obstetrics & Gynecology

## 2018-03-07 ENCOUNTER — Other Ambulatory Visit: Payer: Self-pay

## 2018-03-07 ENCOUNTER — Encounter (HOSPITAL_COMMUNITY): Payer: Self-pay

## 2018-03-07 DIAGNOSIS — Z3A00 Weeks of gestation of pregnancy not specified: Secondary | ICD-10-CM | POA: Diagnosis not present

## 2018-03-07 DIAGNOSIS — Z3A39 39 weeks gestation of pregnancy: Secondary | ICD-10-CM | POA: Diagnosis not present

## 2018-03-07 DIAGNOSIS — O479 False labor, unspecified: Secondary | ICD-10-CM

## 2018-03-07 DIAGNOSIS — Z3490 Encounter for supervision of normal pregnancy, unspecified, unspecified trimester: Secondary | ICD-10-CM | POA: Diagnosis not present

## 2018-03-07 DIAGNOSIS — Z0371 Encounter for suspected problem with amniotic cavity and membrane ruled out: Secondary | ICD-10-CM | POA: Diagnosis present

## 2018-03-07 NOTE — MAU Note (Signed)
Pt reports she has had contractions all afternoon. Denies any vag bleeding or leaking. Good etal movement reported

## 2018-03-07 NOTE — Progress Notes (Addendum)
G1 @ 39.[redacted] wksga. Here dt ctx that started  Denies LOF or bleeding. +FM  EFM applied.   VE: closed  2120: Provider notified. Report status of pt given. Orders received to d/c pt home on labor precaution  2130: D/c instructions given with pt understanding. Pt left unit via ambulatory with SO.   2140: pt didn't want to sign the pad so asked she sign the paper for d/c.

## 2018-03-08 ENCOUNTER — Telehealth: Payer: Self-pay

## 2018-03-08 NOTE — Telephone Encounter (Signed)
Spoke with pts mother and pt who state pt is still having irregular contractions and having labor pain. Pt denies any vaginal bleeding, irregular discharge- denies decreased fetal movement. I advised pt to take warm, not hot, sitz bath for pain and use good breathing techniques. Advised pt to go to hospital if contractions become regular, any bleeding or decrease fetal movement occurs, or if water breaks. I also advised that the provider will schedule her for induction at tomorrow appointment in office if pt has not delivered by then. Pt verbalized understanding.

## 2018-03-09 ENCOUNTER — Encounter: Payer: Self-pay | Admitting: Medical

## 2018-03-09 ENCOUNTER — Ambulatory Visit (INDEPENDENT_AMBULATORY_CARE_PROVIDER_SITE_OTHER): Payer: 59 | Admitting: Medical

## 2018-03-09 VITALS — BP 129/82 | HR 106 | Wt 211.6 lb

## 2018-03-09 DIAGNOSIS — Z3480 Encounter for supervision of other normal pregnancy, unspecified trimester: Secondary | ICD-10-CM

## 2018-03-09 DIAGNOSIS — O163 Unspecified maternal hypertension, third trimester: Secondary | ICD-10-CM

## 2018-03-09 DIAGNOSIS — O162 Unspecified maternal hypertension, second trimester: Secondary | ICD-10-CM

## 2018-03-09 DIAGNOSIS — B951 Streptococcus, group B, as the cause of diseases classified elsewhere: Secondary | ICD-10-CM

## 2018-03-09 NOTE — Progress Notes (Signed)
Patient reports good fetal movement with irregular contractions, pressure and back pain.

## 2018-03-09 NOTE — Patient Instructions (Signed)
Research childbirth classes and hospital preregistration at [email or web address]  Fetal Movement Counts Patient Name: ________________________________________________ Patient Due Date: ____________________ What is a fetal movement count? A fetal movement count is the number of times that you feel your baby move during a certain amount of time. This may also be called a fetal kick count. A fetal movement count is recommended for every pregnant woman. You may be asked to start counting fetal movements as early as week 28 of your pregnancy. Pay attention to when your baby is most active. You may notice your baby's sleep and wake cycles. You may also notice things that make your baby move more. You should do a fetal movement count:  When your baby is normally most active.  At the same time each day.  A good time to count movements is while you are resting, after having something to eat and drink. How do I count fetal movements? 1. Find a quiet, comfortable area. Sit, or lie down on your side. 2. Write down the date, the start time and stop time, and the number of movements that you felt between those two times. Take this information with you to your health care visits. 3. For 2 hours, count kicks, flutters, swishes, rolls, and jabs. You should feel at least 10 movements during 2 hours. 4. You may stop counting after you have felt 10 movements. 5. If you do not feel 10 movements in 2 hours, have something to eat and drink. Then, keep resting and counting for 1 hour. If you feel at least 4 movements during that hour, you may stop counting. Contact a health care provider if:  You feel fewer than 4 movements in 2 hours.  Your baby is not moving like he or she usually does. Date: ____________ Start time: ____________ Stop time: ____________ Movements: ____________ Date: ____________ Start time: ____________ Stop time: ____________ Movements: ____________ Date: ____________ Start time: ____________  Stop time: ____________ Movements: ____________ Date: ____________ Start time: ____________ Stop time: ____________ Movements: ____________ Date: ____________ Start time: ____________ Stop time: ____________ Movements: ____________ Date: ____________ Start time: ____________ Stop time: ____________ Movements: ____________ Date: ____________ Start time: ____________ Stop time: ____________ Movements: ____________ Date: ____________ Start time: ____________ Stop time: ____________ Movements: ____________ Date: ____________ Start time: ____________ Stop time: ____________ Movements: ____________ This information is not intended to replace advice given to you by your health care provider. Make sure you discuss any questions you have with your health care provider. Document Released: 09/10/2006 Document Revised: 04/09/2016 Document Reviewed: 09/20/2015 Elsevier Interactive Patient Education  2018 [employer]  Braxton Hicks Contractions Contractions of the uterus can occur throughout pregnancy, but they are not always a sign that you are in labor. You may have practice contractions called Braxton Hicks contractions. These false labor contractions are sometimes confused with true labor. What are Braxton Hicks contractions? Braxton Hicks contractions are tightening movements that occur in the muscles of the uterus before labor. Unlike true labor contractions, these contractions do not result in opening (dilation) and thinning of the cervix. Toward the end of pregnancy (32-34 weeks), Braxton Hicks contractions can happen more often and may become stronger. These contractions are sometimes difficult to tell apart from true labor because they can be very uncomfortable. You should not feel embarrassed if you go to the hospital with false labor. Sometimes, the only way to tell if you are in true labor is for your health care provider to look for changes in the cervix. The health care provider will   do a physical  exam and may monitor your contractions. If you are not in true labor, the exam should show that your cervix is not dilating and your water has not broken. If there are other health problems associated with your pregnancy, it is completely safe for you to be sent home with false labor. You may continue to have Braxton Hicks contractions until you go into true labor. How to tell the difference between true labor and false labor True labor  Contractions last 30-70 seconds.  Contractions become very regular.  Discomfort is usually felt in the top of the uterus, and it spreads to the lower abdomen and low back.  Contractions do not go away with walking.  Contractions usually become more intense and increase in frequency.  The cervix dilates and gets thinner. False labor  Contractions are usually shorter and not as strong as true labor contractions.  Contractions are usually irregular.  Contractions are often felt in the front of the lower abdomen and in the groin.  Contractions may go away when you walk around or change positions while lying down.  Contractions get weaker and are shorter-lasting as time goes on.  The cervix usually does not dilate or become thin. Follow these instructions at home:  Take over-the-counter and prescription medicines only as told by your health care provider.  Keep up with your usual exercises and follow other instructions from your health care provider.  Eat and drink lightly if you think you are going into labor.  If Braxton Hicks contractions are making you uncomfortable: ? Change your position from lying down or resting to walking, or change from walking to resting. ? Sit and rest in a tub of warm water. ? Drink enough fluid to keep your urine pale yellow. Dehydration may cause these contractions. ? Do slow and deep breathing several times an hour.  Keep all follow-up prenatal visits as told by your health care provider. This is  important. Contact a health care provider if:  You have a fever.  You have continuous pain in your abdomen. Get help right away if:  Your contractions become stronger, more regular, and closer together.  You have fluid leaking or gushing from your vagina.  You pass blood-tinged mucus (bloody show).  You have bleeding from your vagina.  You have low back pain that you never had before.  You feel your baby's head pushing down and causing pelvic pressure.  Your baby is not moving inside you as much as it used to. Summary  Contractions that occur before labor are called Braxton Hicks contractions, false labor, or practice contractions.  Braxton Hicks contractions are usually shorter, weaker, farther apart, and less regular than true labor contractions. True labor contractions usually become progressively stronger and regular and they become more frequent.  Manage discomfort from Braxton Hicks contractions by changing position, resting in a warm bath, drinking plenty of water, or practicing deep breathing. This information is not intended to replace advice given to you by your health care provider. Make sure you discuss any questions you have with your health care provider. Document Released: 12/25/2016 Document Revised: 12/25/2016 Document Reviewed: 12/25/2016 Elsevier Interactive Patient Education  2018 Elsevier Inc.    

## 2018-03-09 NOTE — Progress Notes (Signed)
   PRENATAL VISIT NOTE  Subjective:  Pamela Brandt is a 27 y.o. G2P0010 at 9059w4d being seen today for ongoing prenatal care.  She is currently monitored for the following issues for this low-risk pregnancy and has Diverticulitis; Hx of iron deficiency anemia; Incomplete abortion; Microcytic anemia; Supervision of other normal pregnancy, antepartum; Vitamin D deficiency; Elevated blood pressure affecting pregnancy in second trimester, antepartum; Hemorrhoids during pregnancy in third trimester; and Positive GBS test on their problem list.  Patient reports occasional contractions.  Contractions: Irregular. Vag. Bleeding: None.  Movement: Present. Denies leaking of fluid.   The following portions of the patient's history were reviewed and updated as appropriate: allergies, current medications, past family history, past medical history, past social history, past surgical history and problem list. Problem list updated.  Objective:   Vitals:   03/09/18 1053  BP: 129/82  Pulse: (!) 106  Weight: 211 lb 9.6 oz (96 kg)    Fetal Status: Fetal Heart Rate (bpm): 152 Fundal Height: 39 cm Movement: Present  Presentation: Vertex  General:  Alert, oriented and cooperative. Patient is in no acute distress.  Skin: Skin is warm and dry. No rash noted.   Cardiovascular: Normal heart rate noted  Respiratory: Normal respiratory effort, no problems with respiration noted  Abdomen: Soft, gravid, appropriate for gestational age.  Pain/Pressure: Present     Pelvic: Cervical exam performed Dilation: Fingertip Effacement (%): 50 Station: -3  Extremities: Normal range of motion.  Edema: Trace  Mental Status: Normal mood and affect. Normal behavior. Normal judgment and thought content.   Assessment and Plan:  Pregnancy: G2P0010 at 759w4d  1. Supervision of other normal pregnancy, antepartum - Patient scheduled for IOL for 03/13/18 for postdates pregnancy   2. Positive GBS test - Treat in labor   3. Elevated  blood pressure affecting pregnancy in second trimester, antepartum - Normotensive today   Term labor symptoms and general obstetric precautions including but not limited to vaginal bleeding, contractions, leaking of fluid and fetal movement were reviewed in detail with the patient. Please refer to After Visit Summary for other counseling recommendations.  Return in about 1 month (around 04/06/2018) for PP visit.  Vonzella NippleJulie Leiam Hopwood, PA-C

## 2018-03-10 ENCOUNTER — Encounter (HOSPITAL_COMMUNITY): Payer: Self-pay | Admitting: *Deleted

## 2018-03-10 ENCOUNTER — Telehealth (HOSPITAL_COMMUNITY): Payer: Self-pay | Admitting: *Deleted

## 2018-03-10 NOTE — Telephone Encounter (Signed)
Preadmission screen  

## 2018-03-13 ENCOUNTER — Inpatient Hospital Stay (HOSPITAL_COMMUNITY): Payer: 59 | Admitting: Anesthesiology

## 2018-03-13 ENCOUNTER — Inpatient Hospital Stay (HOSPITAL_COMMUNITY)
Admission: AD | Admit: 2018-03-13 | Discharge: 2018-03-16 | DRG: 807 | Disposition: A | Discharge status: Home / Self Care | Payer: 59 | Attending: Obstetrics and Gynecology | Admitting: Obstetrics and Gynecology

## 2018-03-13 ENCOUNTER — Inpatient Hospital Stay (HOSPITAL_COMMUNITY): Admission: RE | Admit: 2018-03-13 | Payer: 59 | Source: Ambulatory Visit

## 2018-03-13 ENCOUNTER — Encounter (HOSPITAL_COMMUNITY): Payer: Self-pay | Admitting: *Deleted

## 2018-03-13 DIAGNOSIS — O99824 Streptococcus B carrier state complicating childbirth: Secondary | ICD-10-CM | POA: Diagnosis present

## 2018-03-13 DIAGNOSIS — O480 Post-term pregnancy: Secondary | ICD-10-CM | POA: Diagnosis present

## 2018-03-13 DIAGNOSIS — B951 Streptococcus, group B, as the cause of diseases classified elsewhere: Secondary | ICD-10-CM | POA: Diagnosis present

## 2018-03-13 DIAGNOSIS — O2243 Hemorrhoids in pregnancy, third trimester: Secondary | ICD-10-CM

## 2018-03-13 DIAGNOSIS — Z3A40 40 weeks gestation of pregnancy: Secondary | ICD-10-CM | POA: Diagnosis not present

## 2018-03-13 DIAGNOSIS — Z3A41 41 weeks gestation of pregnancy: Secondary | ICD-10-CM | POA: Diagnosis not present

## 2018-03-13 HISTORY — DX: Vitamin D deficiency, unspecified: E55.9

## 2018-03-13 HISTORY — DX: Diverticulitis of intestine, part unspecified, without perforation or abscess without bleeding: K57.92

## 2018-03-13 HISTORY — DX: Iron deficiency anemia, unspecified: D50.9

## 2018-03-13 LAB — CBC
HEMATOCRIT: 34.6 % — AB (ref 36.0–46.0)
HEMOGLOBIN: 11.5 g/dL — AB (ref 12.0–15.0)
MCH: 27.5 pg (ref 26.0–34.0)
MCHC: 33.2 g/dL (ref 30.0–36.0)
MCV: 82.8 fL (ref 78.0–100.0)
Platelets: 196 10*3/uL (ref 150–400)
RBC: 4.18 MIL/uL (ref 3.87–5.11)
RDW: 13.3 % (ref 11.5–15.5)
WBC: 7.7 10*3/uL (ref 4.0–10.5)

## 2018-03-13 LAB — ABO/RH: ABO/RH(D): O POS

## 2018-03-13 LAB — TYPE AND SCREEN
ABO/RH(D): O POS
ANTIBODY SCREEN: NEGATIVE

## 2018-03-13 LAB — RPR: RPR Ser Ql: NONREACTIVE

## 2018-03-13 MED ORDER — TERBUTALINE SULFATE 1 MG/ML IJ SOLN
0.2500 mg | Freq: Once | INTRAMUSCULAR | Status: DC | PRN
  Filled 2018-03-13: qty 1

## 2018-03-13 MED ORDER — LIDOCAINE HCL (PF) 1 % IJ SOLN
INTRAMUSCULAR | Status: DC | PRN
  Administered 2018-03-13: 13 mL via EPIDURAL

## 2018-03-13 MED ORDER — ONDANSETRON HCL 4 MG/2ML IJ SOLN
4.0000 mg | Freq: Four times a day (QID) | INTRAMUSCULAR | Status: DC | PRN
  Administered 2018-03-13 – 2018-03-14 (×2): 4 mg via INTRAVENOUS
  Filled 2018-03-13 (×2): qty 2

## 2018-03-13 MED ORDER — SODIUM CHLORIDE 0.9 % IV SOLN
5.0000 10*6.[IU] | Freq: Once | INTRAVENOUS | Status: AC
  Administered 2018-03-13: 5 10*6.[IU] via INTRAVENOUS
  Filled 2018-03-13: qty 5

## 2018-03-13 MED ORDER — ACETAMINOPHEN 325 MG PO TABS: 650.0000 mg | ORAL_TABLET | ORAL | Status: DC | PRN

## 2018-03-13 MED ORDER — OXYTOCIN 40 UNITS IN LACTATED RINGERS INFUSION - SIMPLE MED
2.5000 [IU]/h | INTRAVENOUS | Status: DC
  Administered 2018-03-14: 2.5 [IU]/h via INTRAVENOUS
  Filled 2018-03-13: qty 1000

## 2018-03-13 MED ORDER — FLEET ENEMA 7-19 GM/118ML RE ENEM: 1.0000 | ENEMA | RECTAL | Status: DC | PRN

## 2018-03-13 MED ORDER — FENTANYL 2.5 MCG/ML BUPIVACAINE 1/10 % EPIDURAL INFUSION (WH - ANES)
14.0000 mL/h | INTRAMUSCULAR | Status: DC | PRN
  Administered 2018-03-13 – 2018-03-14 (×3): 14 mL/h via EPIDURAL
  Filled 2018-03-13 (×3): qty 100

## 2018-03-13 MED ORDER — PENICILLIN G POT IN DEXTROSE 60000 UNIT/ML IV SOLN
3.0000 10*6.[IU] | INTRAVENOUS | Status: DC
  Administered 2018-03-13 – 2018-03-14 (×7): 3 10*6.[IU] via INTRAVENOUS
  Filled 2018-03-13 (×9): qty 50

## 2018-03-13 MED ORDER — LIDOCAINE HCL (PF) 1 % IJ SOLN
30.0000 mL | INTRAMUSCULAR | Status: DC | PRN
  Filled 2018-03-13: qty 30

## 2018-03-13 MED ORDER — MISOPROSTOL 50MCG HALF TABLET
50.0000 ug | ORAL_TABLET | ORAL | Status: DC | PRN
  Administered 2018-03-13 (×2): 50 ug via BUCCAL
  Filled 2018-03-13 (×4): qty 1

## 2018-03-13 MED ORDER — EPHEDRINE 5 MG/ML INJ
10.0000 mg | INTRAVENOUS | Status: DC | PRN
  Filled 2018-03-13: qty 2

## 2018-03-13 MED ORDER — LACTATED RINGERS IV SOLN
500.0000 mL | Freq: Once | INTRAVENOUS | Status: AC
  Administered 2018-03-13: 500 mL via INTRAVENOUS

## 2018-03-13 MED ORDER — ZOLPIDEM TARTRATE 5 MG PO TABS
5.0000 mg | ORAL_TABLET | Freq: Every evening | ORAL | Status: DC | PRN
  Administered 2018-03-13: 5 mg via ORAL
  Filled 2018-03-13: qty 1

## 2018-03-13 MED ORDER — LACTATED RINGERS IV SOLN
INTRAVENOUS | Status: DC
  Administered 2018-03-13 (×3): via INTRAVENOUS

## 2018-03-13 MED ORDER — OXYCODONE-ACETAMINOPHEN 5-325 MG PO TABS: 2.0000 | ORAL_TABLET | ORAL | Status: DC | PRN

## 2018-03-13 MED ORDER — LACTATED RINGERS IV SOLN
500.0000 mL | INTRAVENOUS | Status: DC | PRN
  Administered 2018-03-13: 500 mL via INTRAVENOUS

## 2018-03-13 MED ORDER — MISOPROSTOL 25 MCG QUARTER TABLET: 25.0000 ug | ORAL_TABLET | ORAL | Status: DC | PRN

## 2018-03-13 MED ORDER — OXYTOCIN BOLUS FROM INFUSION
500.0000 mL | Freq: Once | INTRAVENOUS | Status: AC
  Administered 2018-03-14: 500 mL via INTRAVENOUS

## 2018-03-13 MED ORDER — FENTANYL CITRATE (PF) 100 MCG/2ML IJ SOLN
100.0000 ug | INTRAMUSCULAR | Status: DC | PRN
  Administered 2018-03-13 (×4): 100 ug via INTRAVENOUS
  Filled 2018-03-13 (×4): qty 2

## 2018-03-13 MED ORDER — SOD CITRATE-CITRIC ACID 500-334 MG/5ML PO SOLN: 30.0000 mL | ORAL | Status: DC | PRN

## 2018-03-13 MED ORDER — DIPHENHYDRAMINE HCL 50 MG/ML IJ SOLN: 12.5000 mg | INTRAMUSCULAR | Status: DC | PRN

## 2018-03-13 MED ORDER — PHENYLEPHRINE 40 MCG/ML (10ML) SYRINGE FOR IV PUSH (FOR BLOOD PRESSURE SUPPORT)
80.0000 ug | PREFILLED_SYRINGE | INTRAVENOUS | Status: DC | PRN
  Filled 2018-03-13: qty 5
  Filled 2018-03-13: qty 10

## 2018-03-13 MED ORDER — OXYTOCIN 40 UNITS IN LACTATED RINGERS INFUSION - SIMPLE MED
1.0000 m[IU]/min | INTRAVENOUS | Status: DC
  Administered 2018-03-13: 6 m[IU]/min via INTRAVENOUS
  Administered 2018-03-13: 2 m[IU]/min via INTRAVENOUS
  Administered 2018-03-14: 12 m[IU]/min via INTRAVENOUS

## 2018-03-13 MED ORDER — PHENYLEPHRINE 40 MCG/ML (10ML) SYRINGE FOR IV PUSH (FOR BLOOD PRESSURE SUPPORT)
80.0000 ug | PREFILLED_SYRINGE | INTRAVENOUS | Status: DC | PRN
  Filled 2018-03-13: qty 5

## 2018-03-13 MED ORDER — OXYCODONE-ACETAMINOPHEN 5-325 MG PO TABS: 1.0000 | ORAL_TABLET | ORAL | Status: DC | PRN

## 2018-03-13 NOTE — Progress Notes (Signed)
Labor Progress Note Pamela Brandt is a 27 y.o. G2P0010 at 1259w1d presented for IOL for Post dates   S:  She is coping well post Epidural and is supported by family at bedside and has tolerated foley insertion at approx. 5:45pm  O:  BP 118/65   Pulse 86   Temp 97.6 F (36.4 C) (Oral)   Resp 18   Ht 5\' 4"  (1.626 m)   Wt 94.8 kg (209 lb)   LMP 06/13/2017   SpO2 100%   BMI 35.87 kg/m  EFM: baseline 140s bpm/ sleep pattern with moderate variability/ currently sleep pattern previous accels, early decels  Toco: 1 every 4 mins Fowley bulb in place at 5:45PM during following cervical check: SVE: Dilation: 1.5 Effacement (%): 50 Cervical Position: Middle Station: -2 Presentation: Vertex Exam by:: Dr Hyman BibleAlvis   A/P: 27 y.o. G2P0010 2359w1d  1. Labor: early Latent 2. FWB: Cat 1 3. Pain: None  Cytotec was given x2 Fowley bulb in place and still in early latent phase  Fowley traction and monitor for when bulb falls out. Continue pain management  Anticipated NSVD mode of delivery    Pamela RavelingAnson B Jenavi Beedle, MD 6:00 PM

## 2018-03-13 NOTE — H&P (Signed)
Pamela GuntherMaerena Brandt is a 27 y.o. female G2P0010 @ 40.1wks by 9 wk scan presenting for IOL due to postdates. Denies leaking, bldg, N/V/D or H/A. Her preg [facility] and has been remarkable for 1) labile BPs in 2nd trimester 2) diverticulitis 3) GBS pos. Reports cx being dilated a fingertip at last exam.   OB History    Gravida  2   Para      Term      Preterm      AB  1   Living  0     SAB  1   TAB      Ectopic      Multiple      Live Births             No past medical history on file. Past Surgical History:  Procedure Laterality Date  . DILATION AND CURETTAGE OF UTERUS  2018   Family History: family history includes Leukemia in her maternal grandmother; Lupus in her maternal grandmother. Social History:  reports that she has never smoked. She has never used smokeless tobacco. She reports that she does not drink alcohol or use drugs.     Maternal Diabetes: No Genetic Screening: Normal Maternal Ultrasounds/Referrals: Normal Fetal Ultrasounds or other Referrals:  None Maternal Substance Abuse:  No Significant Maternal Medications:  None Significant Maternal Lab Results:  Lab values include: Group B Strep positive Other Comments:  None  ROS History   Last menstrual period 06/13/2017, unknown if currently breastfeeding. Exam Physical Exam  Constitutional: She is oriented to person, place, and time. She appears well-developed.  HENT:  Head: Normocephalic.  Neck: Normal range of motion.  Cardiovascular: Normal rate.  Respiratory: Effort normal.  GI:  EFM 145, +accels, no decels in initial tracing Irreg mild ctx  Musculoskeletal: Normal range of motion.  Neurological: She is alert and oriented to person, place, and time.  Skin: Skin is warm and dry.  Psychiatric: She has a normal mood and affect. Her behavior is normal. Thought content normal.    Prenatal labs: ABO, Rh: O/Positive/-- (01/08 1135) Antibody: Negative (01/08  1135) Rubella: 1.35 (01/08 1135) RPR: Non Reactive (04/23 1046)  HBsAg: Negative (01/08 1135)  HIV: Non Reactive (04/23 1046)  GBS: Positive (06/18 0936)   Assessment/Plan: IUP@40 .1wks Cx unfavorable Labile BPs GBS pos  [facility] Plan cx ripening with buccal cytotec to start, then cervical foley, Pit/AROM mostly likely PCN for GBS ppx when laboring/ROM Anticipate SVD   Pamela Brandt CNM 03/13/2018, 1:56 AM

## 2018-03-13 NOTE — Progress Notes (Signed)
Patient ID: Gerarda GuntherMaerena Defeo, female   DOB: 11/11/1990, 27 y.o.   MRN: [id number]  Feeling some cramping; cyto x 1 and due for another  BP 124/71, other VSS FHR 150s, +accels, no decels, some decreased variability at times Ctx q 1-2 mins Cx still closed  IUP@term  Cx unfavorable GBS pos  Due to freq ctx, provider will need to place next cytotec dose- will do so after sign out Continue cyto until cervical foley can be placed  Cam HaiSHAW, Khalilah Hoke CNM 03/13/2018

## 2018-03-13 NOTE — Progress Notes (Signed)
Patient ID: Gerarda GuntherMaerena Engel, female   DOB: 09/21/1990, 27 y.o.   MRN: [id number] Doing well  Foley came out  Vitals:   03/13/18 2131 03/13/18 2202 03/13/18 2232 03/13/18 2302  BP: 123/67 127/69 129/69 125/60  Pulse: 83 82 85 94  Resp: 16 16 16 16   Temp:      TempSrc:      SpO2:      Weight:      Height:       Dilation: 3.5 Effacement (%): 100 Cervical Position: Middle Station: -2 Presentation: Vertex Exam by:: Gwendolyn GrantMadison Hicks, RN   Will continue to observe

## 2018-03-13 NOTE — Progress Notes (Signed)
Pamela Brandt is a 27 y.o. G2P0010 at 5018w1d by ultrasound admitted for induction of labor due to Post dates. Due date 03/12/2018.  Subjective: Coping well with contractions with IV Fentanyl. In shower now. Supportive family at bedside  Objective: BP 120/69   Pulse 85   Temp 98.6 F (37 C) (Oral)   Resp 16   Ht 5\' 4"  (1.626 m)   Wt 209 lb (94.8 kg)   LMP 06/13/2017   BMI 35.87 kg/m  No intake/output data recorded. No intake/output data recorded.  FHT:  FHR: 145 bpm, variability: moderate,  accelerations:  Present,  decelerations:  Absent UC:   irregular, every 1-5 minutes SVE:   Dilation: Closed Effacement (%): Thick Station: -2 Exam by:: dr Hyman Biblealvis  Labs: Lab Results  Component Value Date   WBC 7.7 03/13/2018   HGB 11.5 (L) 03/13/2018   HCT 34.6 (L) 03/13/2018   MCV 82.8 03/13/2018   PLT 196 03/13/2018    Assessment / Plan: Induction of labor due to postterm,  progressing well on pitocin Will check cervix after patient shower to assess feasibility of foley bulb vs. Additional Cytotec Fetal Wellbeing:  Category I Pain Control:  IV pain meds, planning epidural Anticipated MOD:  NSVD  Calvert CantorSamantha C Iretta Mangrum, CNM 03/13/2018, 1:30 PM

## 2018-03-13 NOTE — Anesthesia Preprocedure Evaluation (Signed)

## 2018-03-13 NOTE — Progress Notes (Signed)
Patient ID: Pamela Brandt, female   DOB: 06/04/1991, 27 y.o.   MRN: [id number] Doing well, comfortable  Vitals:   03/13/18 2031 03/13/18 2101 03/13/18 2131 03/13/18 2202  BP: 120/69 125/67 123/67 127/69  Pulse: 85 (!) 108 83 82  Resp: 16 16 16 16   Temp: 98 F (36.7 C)     TempSrc: Axillary     SpO2:      Weight:      Height:       FHR reactive UCs every 4 min  Dilation: 1.5 Effacement (%): 100 Cervical Position: Middle Station: -2 Presentation: Vertex Exam by:: Wynelle BourgeoisMarie Beecher Furio, CNM   Balloon in place, + show  WIll start Pitocin at 602mu/min

## 2018-03-13 NOTE — Progress Notes (Signed)
Pamela Brandt is a 27 y.o. G2P0010 at 2475w1d by ultrasound admitted for IOL for postdates.   Subjective: Patient actively vomiting when Providers entered room. States she had breakfast at around 4:30am. Feels better after vomiting, declines Zofran.  Objective: BP 124/71   Pulse 94   Temp 98.6 F (37 C) (Oral)   Resp 16   Ht 5\' 4"  (1.626 m)   Wt 209 lb (94.8 kg)   LMP 06/13/2017   BMI 35.87 kg/m  No intake/output data recorded. No intake/output data recorded.   800mL emesis upon Provider entry to room.  FHT:  FHR: 140 bpm, variability: moderate,  accelerations:  Present,  decelerations:  Absent UC:   irregular, every 2-5 minutes SVE:   Dilation: Closed Effacement (%): Thick Station: -2 Exam by:: dr Pamela Brandt  Labs: Lab Results  Component Value Date   WBC 7.7 03/13/2018   HGB 11.5 (L) 03/13/2018   HCT 34.6 (L) 03/13/2018   MCV 82.8 03/13/2018   PLT 196 03/13/2018    Assessment / Plan: IOL, unripe cervix, place 2nd Cytotec 50mcg buccal now  GBS POS: initiate prophylaxis with active labor  Labor: N/A cervical ripening progressing as expected Preeclampsia:  N/A Fetal Wellbeing:  Category I Pain Control:  Labor support without medications, planning epidural I/D:  n/a Anticipated MOD:  NSVD  Pamela Brandt, CNM 03/13/2018, 9:00 AM

## 2018-03-13 NOTE — Anesthesia Procedure Notes (Signed)
Epidural Patient location during procedure: OB Start time: 03/13/2018 3:22 PM End time: 03/13/2018 3:36 PM  Staffing Anesthesiologist: Lowella CurbMiller, Nycole Kawahara Ray, MD Performed: anesthesiologist   Preanesthetic Checklist Completed: patient identified, site marked, surgical consent, pre-op evaluation, timeout performed, IV checked, risks and benefits discussed and monitors and equipment checked  Epidural Patient position: sitting Prep: ChloraPrep Patient monitoring: heart rate, cardiac monitor, continuous pulse ox and blood pressure Approach: midline Location: L2-L3 Injection technique: LOR saline  Needle:  Needle type: Tuohy  Needle gauge: 17 G Needle length: 9 cm Needle insertion depth: 7 cm Catheter type: closed end flexible Catheter size: 20 Guage Catheter at skin depth: 11 cm Test dose: negative  Assessment Events: blood not aspirated, injection not painful, no injection resistance, negative IV test and no paresthesia  Additional Notes Reason for block:procedure for pain

## 2018-03-13 NOTE — Anesthesia Pain Management Evaluation Note (Signed)
  CRNA Pain Management Visit Note  Patient: Pamela Brandt, 27 y.o., female  "Hello I am a [facility] of the anesthesia team at [facility]. We have an anesthesia team available at all times to provide care throughout the hospital, including epidural management and anesthesia for C-section. I don't know your plan for the delivery whether it a natural birth, water birth, IV sedation, nitrous supplementation, doula or epidural, but we want to meet your pain goals."   1.Was your pain managed to your expectations on prior hospitalizations?   No prior hospitalizations  2.What is your expectation for pain management during this hospitalization?     Epidural  3.How can we help you reach that goal?   Record the patient's initial score and the patient's pain goal.   Pain: 7  Pain Goal: 9 [facility] wants you to be able to say your pain was always managed very well.  Pamela Brandt,Pamela Brandt 03/13/2018

## 2018-03-14 ENCOUNTER — Encounter (HOSPITAL_COMMUNITY): Payer: Self-pay

## 2018-03-14 DIAGNOSIS — Z3A41 41 weeks gestation of pregnancy: Secondary | ICD-10-CM

## 2018-03-14 DIAGNOSIS — O480 Post-term pregnancy: Secondary | ICD-10-CM

## 2018-03-14 DIAGNOSIS — O99824 Streptococcus B carrier state complicating childbirth: Secondary | ICD-10-CM

## 2018-03-14 MED ORDER — MEASLES, MUMPS & RUBELLA VAC ~~LOC~~ INJ
0.5000 mL | INJECTION | Freq: Once | SUBCUTANEOUS | Status: DC
  Filled 2018-03-14: qty 0.5

## 2018-03-14 MED ORDER — SENNOSIDES-DOCUSATE SODIUM 8.6-50 MG PO TABS
2.0000 | ORAL_TABLET | ORAL | Status: DC
  Administered 2018-03-14 – 2018-03-15 (×2): 2 via ORAL
  Filled 2018-03-14 (×2): qty 2

## 2018-03-14 MED ORDER — SODIUM CHLORIDE 0.9% FLUSH: 3.0000 mL | Freq: Two times a day (BID) | INTRAVENOUS | Status: DC

## 2018-03-14 MED ORDER — MENTHOL 3 MG MT LOZG
1.0000 | LOZENGE | OROMUCOSAL | Status: DC | PRN
  Administered 2018-03-14: 3 mg via ORAL
  Filled 2018-03-14: qty 9

## 2018-03-14 MED ORDER — COCONUT OIL OIL
1.0000 "application " | TOPICAL_OIL | Status: DC | PRN
  Filled 2018-03-14: qty 120

## 2018-03-14 MED ORDER — SODIUM CHLORIDE 0.9% FLUSH: 3.0000 mL | INTRAVENOUS | Status: DC | PRN

## 2018-03-14 MED ORDER — SODIUM CHLORIDE 0.9 % IV SOLN: 250.0000 mL | INTRAVENOUS | Status: DC | PRN

## 2018-03-14 MED ORDER — ONDANSETRON HCL 4 MG PO TABS: 4.0000 mg | ORAL_TABLET | ORAL | Status: DC | PRN

## 2018-03-14 MED ORDER — DIBUCAINE 1 % RE OINT: 1.0000 "application " | TOPICAL_OINTMENT | RECTAL | Status: DC | PRN

## 2018-03-14 MED ORDER — HYDROCORTISONE ACETATE 25 MG RE SUPP
25.0000 mg | Freq: Two times a day (BID) | RECTAL | Status: DC
  Filled 2018-03-14 (×7): qty 1

## 2018-03-14 MED ORDER — ZOLPIDEM TARTRATE 5 MG PO TABS: 5.0000 mg | ORAL_TABLET | Freq: Every evening | ORAL | Status: DC | PRN

## 2018-03-14 MED ORDER — ONDANSETRON HCL 4 MG/2ML IJ SOLN: 4.0000 mg | INTRAMUSCULAR | Status: DC | PRN

## 2018-03-14 MED ORDER — BENZOCAINE-MENTHOL 20-0.5 % EX AERO
1.0000 "application " | INHALATION_SPRAY | CUTANEOUS | Status: DC | PRN
  Administered 2018-03-14: 1 via TOPICAL
  Filled 2018-03-14: qty 56

## 2018-03-14 MED ORDER — TETANUS-DIPHTH-ACELL PERTUSSIS 5-2.5-18.5 LF-MCG/0.5 IM SUSP: 0.5000 mL | Freq: Once | INTRAMUSCULAR | Status: DC

## 2018-03-14 MED ORDER — SIMETHICONE 80 MG PO CHEW: 80.0000 mg | CHEWABLE_TABLET | ORAL | Status: DC | PRN

## 2018-03-14 MED ORDER — ACETAMINOPHEN 325 MG PO TABS
650.0000 mg | ORAL_TABLET | ORAL | Status: DC | PRN
  Administered 2018-03-14 – 2018-03-16 (×3): 650 mg via ORAL
  Filled 2018-03-14 (×3): qty 2

## 2018-03-14 MED ORDER — DIPHENHYDRAMINE HCL 25 MG PO CAPS: 25.0000 mg | ORAL_CAPSULE | Freq: Four times a day (QID) | ORAL | Status: DC | PRN

## 2018-03-14 MED ORDER — IBUPROFEN 600 MG PO TABS
600.0000 mg | ORAL_TABLET | Freq: Four times a day (QID) | ORAL | Status: DC
  Administered 2018-03-14 – 2018-03-16 (×9): 600 mg via ORAL
  Filled 2018-03-14 (×9): qty 1

## 2018-03-14 MED ORDER — PRENATAL MULTIVITAMIN CH
1.0000 | ORAL_TABLET | Freq: Every day | ORAL | Status: DC
  Administered 2018-03-14 – 2018-03-16 (×3): 1 via ORAL
  Filled 2018-03-14 (×3): qty 1

## 2018-03-14 MED ORDER — WITCH HAZEL-GLYCERIN EX PADS: 1.0000 "application " | MEDICATED_PAD | CUTANEOUS | Status: DC | PRN

## 2018-03-14 NOTE — Progress Notes (Signed)
Patient ID: Pamela GuntherMaerena Brandt, female   DOB: 05/11/1991, 27 y.o.   MRN: [id number] Doing well  Vitals:   03/14/18 0202 03/14/18 0232 03/14/18 0300 03/14/18 0302  BP: 136/78 134/82 131/76 131/76  Pulse: 86 94 95 95  Resp: 16 16 16 16   Temp:      TempSrc:      SpO2:      Weight:      Height:       FHR stable UCs every 2 min  Dilation: 5 Effacement (%): 100 Cervical Position: Middle Station: -2 Presentation: Vertex Exam by:: Gwendolyn GrantMadison Hicks, RN   Making good progress with cervical change Anticipate SVD

## 2018-03-14 NOTE — Anesthesia Postprocedure Evaluation (Signed)
Anesthesia Post Note  Patient: Pamela Brandt  Procedure(s) Performed: AN AD HOC LABOR EPIDURAL     Patient location during evaluation: Mother Baby Anesthesia Type: Epidural Level of consciousness: awake Pain management: satisfactory to patient Vital Signs Assessment: post-procedure vital signs reviewed and stable Respiratory status: spontaneous breathing Cardiovascular status: stable Anesthetic complications: no    Last Vitals:  Vitals:   03/14/18 1225 03/14/18 1325  BP: 115/69 124/69  Pulse: 95 91  Resp: 20 18  Temp: 37.2 C 37.3 C  SpO2: 100%     Last Pain:  Vitals:   03/14/18 1325  TempSrc: Oral  PainSc: 5    Pain Goal: Patients Stated Pain Goal: 3 (03/14/18 1325)               Cephus ShellingBURGER,Alexandrya Chim

## 2018-03-14 NOTE — Plan of Care (Signed)
  Problem: Education: Goal: Knowledge of condition will improve Note:  Admission education, safety and unit protocols reviewed with patient. Ambulated patient to the bathroom and educated on peri care and frequent voiding.

## 2018-03-15 NOTE — Lactation Note (Signed)
This note was copied from a baby's chart. Lactation Consultation Note  Patient Name: Pamela Gerarda GuntherMaerena Looman [id number]'s Date: 03/15/2018 Reason for consult: Follow-up assessment;Term P1,  Infant w/ 2% weight loss, Mom crying as LC  entered room infant w/ pacifier in mouth. Mom states Nurse reassure her breastfeeding is going well. She is aware of risk w/ pacifier use. Reinforced not to use pacifier and Mom will try soothing techniques w/ infant STS. Mom gave 11 ml of formula wanted a break from BF earlier w/ slow flow nipple.  Discussed w/ mom other alternatives curve tip syringe, cup or spoon instead of slow flow nipple Mom receptive of alternative feeding methods.   Reinforced cluster feeding after first 24 hours is normal and frequent feeding w/ increase milk supply due infant  sucking at breast. Reassured mom breastfeeding abilities and  [facility] that breastfeeding is getting better and he is latching well.  Per mom, plans not to use any more formula and when infant cues she  plans to breast feed him. LC reinforced  to feed baby 8-12 times/24 hours and with feeding cues.  Will use pump after BF for 15 minutes and give any EBM back to baby instead of formula.  Mom will call LC if she has any more questions or concerns. Discussed engorgement treatment and prevention.  Reinforced LC outpatient clinic, LC hotline, Breastfeeding support groups and community resources.  Maternal Data    Feeding Feeding Type: Breast Fed  LATCH Score Latch: Repeated attempts needed to sustain latch, nipple held in mouth throughout feeding, stimulation needed to elicit sucking reflex.  Audible Swallowing: A few with stimulation  Type of Nipple: Everted at rest and after stimulation  Comfort (Breast/Nipple): Soft / non-tender  Hold (Positioning): No assistance needed to correctly position infant at breast.  LATCH Score: 8  Interventions    Lactation Tools Discussed/Used     Consult  Status Consult Status: Follow-up Date: 03/16/18 Follow-up type: In-patient    Pamela Brandt 03/15/2018, 10:09 PM

## 2018-03-15 NOTE — Lactation Note (Signed)
This note was copied from a baby's chart. Lactation Consultation Note  Patient Name: Pamela Brandt WUJWJ'XToday's Date: 03/15/2018 Reason for consult: Initial assessment;Term   P1, 16 hour female infant.  Mom w/ no prior beastfeeding education. [place] Mom has evert, large short- shaft nipples. Pre-pumped before latching infant to breast. Mom able to demonstrate hand [facility] to [facility]  and colostrum present. Per mom , she has been doing STS since delivery & dad as well. LC assisted mom with latch,  infant was on and off  breast at first, after multiple attempts infant latch on left breast using a tea cup hold in the football positon.  Infant had a few suck swallows and then  finally into a rhythmic nutrive sucking pattern latch-7 infant feed 15 mins. Mom encouraged to feed baby 8-12 times/24 hours and with feeding cues.  Mom will continue to work on breastfeeding and latching infant to breast. Per mom infant had 2 stools and one wet diaper. Discussed I&O. Reviewed Baby & Me book's Breastfeeding Basics.  Mom made aware of O/P services, breastfeeding support groups, community resources, and our phone # for post-discharge questions.  Maternal Data    Feeding Feeding Type: Breast Fed  LATCH Score Latch: Repeated attempts needed to sustain latch, nipple held in mouth throughout feeding, stimulation needed to elicit sucking reflex.  Audible Swallowing: Spontaneous and intermittent  Type of Nipple: Everted at rest and after stimulation(large short shaft nipples)  Comfort (Breast/Nipple): Filling, red/small blisters or bruises, mild/mod discomfort  Hold (Positioning): Assistance needed to correctly position infant at breast and maintain latch.  LATCH Score: 7  Interventions Interventions: Assisted with latch;Skin to skin;Breast massage;Support pillows;Position options;Hand express;Pre-pump if needed;Breast compression;Hand pump;Adjust position;Breast feeding  basics reviewed;Expressed milk  Lactation Tools Discussed/Used Pump Review: Setup, frequency, and cleaning Initiated by:: Danelle Earthlyobin Adra Shepler, IBCLC Date initiated:: 03/15/18   Consult Status Consult Status: Follow-up Date: 03/15/18 Follow-up type: In-patient    Danelle EarthlyRobin Kenyatte Gruber 03/15/2018, 1:55 AM

## 2018-03-15 NOTE — Progress Notes (Signed)
POSTPARTUM PROGRESS NOTE  Post Partum Day 1  Subjective:  Pamela Brandt is a 27 y.o. G2P1011 s/p SVD at 638w2d.  She reports she is doing well. No acute events overnight. She denies any problems with ambulating, voiding or po intake. Pain is well controlled with ibuprofen  Objective: Blood pressure 128/62, pulse 87, temperature 98.6 F (37 C), temperature source Oral, resp. rate 16, height 5\' 4"  (1.626 m), weight 209 lb (94.8 kg), last menstrual period 06/13/2017, SpO2 100 %, unknown if currently breastfeeding.  Physical Exam:  General: alert, cooperative and no distress Chest: no respiratory distress Heart:regular rate, distal pulses intact Abdomen: soft, nontender,  Uterine Fundus: firm, appropriately tender DVT Evaluation: No calf swelling or tenderness Extremities: no edema Skin: warm, dry  Recent Labs    03/13/18 0303  HGB 11.5*  HCT 34.6*    Assessment/Plan: Pamela Brandt is a 27 y.o. G2P1011 s/p SVD at 7638w2d   PPD#1: Doing well  Routine postpartum care Feeding: breast Circ: outpatient Dispo: Plan for discharge tomorrow.   LOS: 2 days   Kandra NicolasJulie P DegeleMD 03/15/2018, 2:23 PM

## 2018-03-16 MED ORDER — IBUPROFEN 600 MG PO TABS: 600.0000 mg | ORAL_TABLET | Freq: Four times a day (QID) | ORAL | 0 refills | Status: AC

## 2018-03-16 MED ORDER — HYDROCORTISONE ACETATE 25 MG RE SUPP: 25.0000 mg | Freq: Two times a day (BID) | RECTAL | 4 refills | Status: AC

## 2018-03-16 NOTE — Discharge Summary (Signed)
OB Discharge Summary     Patient Name: Pamela Brandt DOB: 01/06/1991 MRN: [id number]  Date of admission: 03/13/2018 Delivering MD: Wyvonnia Dusky D   Date of discharge: 03/16/2018  Admitting diagnosis: 6 WKS, ABD PAIN Intrauterine pregnancy: [redacted]w[redacted]d     Secondary diagnosis:  Principal Problem:   SVD (spontaneous vaginal delivery) Active Problems:   Positive GBS test   Post-dates pregnancy  Additional problems: no other problems addressed during this admission     Discharge diagnosis: Term Pregnancy Delivered                                                                                                Post partum procedures: none  Augmentation: Pitocin, Cytotec and Foley Balloon; unsure if rupture of membranes was artificial or spontaneous   Complications: None  Hospital course:  Induction of Labor With Vaginal Delivery   27 y.o. yo G2P1011 at [redacted]w[redacted]d was admitted to the hospital 03/13/2018 for induction of labor.  Indication for induction: Postdates.  Patient had an uncomplicated labor course as follows: Membrane Rupture Time/Date: 3:50 AM ,03/14/2018   Intrapartum Procedures: Episiotomy: None [1]                                         Lacerations:  2nd degree [3]  Patient had delivery of a Viable infant; female Information for the patient's newborn:  Pamela Brandt, Pamela Brandt [[id number]]  Delivery Method: Vag-Spont   03/14/2018  Details of delivery can be found in separate delivery note.  Patient had a routine postpartum course. Patient is discharged home 03/16/18.  Physical exam  Vitals:   03/15/18 0215 03/15/18 0500 03/15/18 1322 03/16/18 0500  BP: 108/81 115/70 128/62 116/62  Pulse: 84 75 87 62  Resp: 18 18 16 18   Temp: 98.8 F (37.1 C) (!) 97.5 F (36.4 C) 98.6 F (37 C) 98 F (36.7 C)  TempSrc:   Oral Oral  SpO2: 100% 100%    Weight:      Height:       General: alert, cooperative and no distress Lochia: appropriate; Pt denies clots and endorses saturation of pad  every 3-4hrs Uterine Fundus: firm Incision: N/A DVT Evaluation: No evidence of DVT seen on physical exam; roughly +1 pitting edema bilaterally.  Legs are not tender to palpation or warm.  Labs: Lab Results  Component Value Date   WBC 7.7 03/13/2018   HGB 11.5 (L) 03/13/2018   HCT 34.6 (L) 03/13/2018   MCV 82.8 03/13/2018   PLT 196 03/13/2018   CMP Latest Ref Rng & Units 09/29/2017  Glucose 65 - 99 mg/dL 91  BUN 6 - 20 mg/dL 7  Creatinine 7.82 - 9.56 mg/dL 2.13(Y)  Sodium 865 - 784 mmol/L 137  Potassium 3.5 - 5.2 mmol/L 4.2  Chloride 96 - 106 mmol/L 105  CO2 20 - 29 mmol/L 19(L)  Calcium 8.7 - 10.2 mg/dL 9.2  Total Protein 6.0 - 8.5 g/dL 6.5  Total Bilirubin 0.0 - 1.2 mg/dL <6.9  Alkaline Phos 39 - 117 IU/L 51  AST 0 - 40 IU/L 13  ALT 0 - 32 IU/L 13    Discharge instruction: per After Visit Summary and "Baby and Me Booklet"; and instructional handout on sign/symptoms of Pre-E   Diet: routine diet  Activity: Advance as tolerated. Pelvic rest for 6 weeks.   Outpatient follow up:6 weeks Follow up Appt:No future appointments. Follow up Visit:No follow-ups on file.  Postpartum contraception: Pt previously had Implanon but does not want to utilize that for postpartum contraception.  Pt endorses use of  Natural Family Planning ("pull out method")  Newborn Data: Live born female  Birth Weight: 8 lb 8 oz (3856 g) APGAR: 8, 9  Newborn Delivery   Birth date/time:  03/14/2018 09:09:00 Delivery type:  Vaginal, Spontaneous     Baby Feeding: Breast, but will supplement with formula Disposition:home with mother   03/16/2018 Pamela Brandt, Medical Student

## 2018-03-16 NOTE — Lactation Note (Signed)
This note was copied from a baby's chart. Lactation Consultation Note  Patient Name: Pamela Brandt AVWUJ'WToday's Date: 03/16/2018 Reason for consult: Follow-up assessment   Baby 48 hours old.  Discussed prepumping, hand expression and offering breast before formula to help establish milk supply. Answered questions and swaddled baby. Mother has coconut oil for sore nipples. Mom encouraged to feed baby 8-12 times/24 hours and with feeding cues.  Reviewed engorgement care and monitoring voids/stools.    Maternal Data    Feeding Feeding Type: Formula  LATCH Score                   Interventions    Lactation Tools Discussed/Used     Consult Status Consult Status: Complete Date: 03/16/18    Dahlia ByesBerkelhammer, Ruth [facility] 03/16/2018, 9:16 AM

## 2018-03-16 NOTE — Progress Notes (Signed)
Patient ID: Pamela GuntherMaerena Brandt, female   DOB: 08/13/1991, 27 y.o.   MRN: [id number]   Seen by Resident and confirmed the progress not made by student this AM. Patient is asymptomatic and his having only mild light loquia rubra and is hemodynamically stable.   BP 116/62   Pulse 62   Temp 98 F (36.7 C) (Oral)   Resp 18   Ht 5\' 4"  (1.626 m)   Wt 94.8 kg (209 lb)   LMP 06/13/2017   SpO2 100%   Breastfeeding? Unknown   BMI 35.87 kg/m   A: SP NSVD for SOL  P: Patient for home with infant and followup in 6 weeks.

## 2018-03-31 ENCOUNTER — Telehealth: Payer: Self-pay

## 2018-03-31 ENCOUNTER — Other Ambulatory Visit: Payer: Self-pay

## 2018-03-31 DIAGNOSIS — R52 Pain, unspecified: Principal | ICD-10-CM

## 2018-03-31 MED ORDER — IBUPROFEN 800 MG PO TABS: 800.0000 mg | ORAL_TABLET | Freq: Three times a day (TID) | ORAL | 3 refills | Status: AC | PRN

## 2018-03-31 NOTE — Telephone Encounter (Signed)
Pt called stating that she is having really bad pain in her vagina since delivery, and is having sharp pain in her lower abdomen. She denies having and UTI sx. She states that she was not able to pick up her rx for the ibuprofen because of the cost.

## 2018-04-21 ENCOUNTER — Telehealth: Payer: Self-pay

## 2018-04-21 NOTE — Telephone Encounter (Signed)
TC from pt regarding vaginal bleeding Pt delivered 03/14/18. Pt states she had bleeding after delivery then it ceased and has started back Pt denies any cramping,pain, no large clots Pt is due for post partum visit Pt advised this may be her cycle Pt noted she is breast feeding Not on any birth control right now. Pt advised to make appt for post partum visit scheduling  made aware  Advised if sx's worsen she may go to women[facility] for an evaluation.

## 2018-04-22 ENCOUNTER — Encounter: Payer: Self-pay | Admitting: Obstetrics

## 2018-04-22 ENCOUNTER — Ambulatory Visit (INDEPENDENT_AMBULATORY_CARE_PROVIDER_SITE_OTHER): Payer: 59 | Admitting: Obstetrics

## 2018-04-22 DIAGNOSIS — Z1389 Encounter for screening for other disorder: Secondary | ICD-10-CM

## 2018-04-22 DIAGNOSIS — Z3009 Encounter for other general counseling and advice on contraception: Secondary | ICD-10-CM

## 2018-04-22 DIAGNOSIS — N946 Dysmenorrhea, unspecified: Secondary | ICD-10-CM

## 2018-04-22 DIAGNOSIS — Z30011 Encounter for initial prescription of contraceptive pills: Secondary | ICD-10-CM

## 2018-04-22 MED ORDER — NORETHINDRONE 0.35 MG PO TABS: 1.0000 | ORAL_TABLET | Freq: Every day | ORAL | 11 refills | Status: AC

## 2018-04-22 MED ORDER — IBUPROFEN 800 MG PO TABS: 800.0000 mg | ORAL_TABLET | Freq: Three times a day (TID) | ORAL | 5 refills | Status: AC | PRN

## 2018-04-22 NOTE — Progress Notes (Signed)
Pt is currently breastfeeding, undecided about BC options. Marland Kitchen..Post Partum Exam  Pamela Brandt is a 27 y.o. 912P1011 female who presents for a postpartum visit. She is 5 weeks postpartum following a spontaneous vaginal delivery. I have fully reviewed the prenatal and intrapartum course. The delivery was at 40.1 gestational weeks.  Anesthesia: epidural. Postpartum course has been good. Baby's course has been good. Baby is feeding by breast. Bleeding thin lochia. Bowel function is normal. Bladder function is normal. Patient is not sexually active. Contraception method is none. Postpartum depression screening:neg  The following portions of the patient's history were reviewed and updated as appropriate: allergies, current medications, past family history, past medical history, past social history, past surgical history and problem list. Last pap smear done JAN. 2019 and was Normal  Review of Systems A comprehensive review of systems was negative.    Objective:  unknown if currently breastfeeding.  General:  alert and no distress   Breasts:  inspection negative, no nipple discharge or bleeding, no masses or nodularity palpable  Lungs: clear to auscultation bilaterally  Heart:  regular rate and rhythm, S1, S2 normal, no murmur, click, rub or gallop  Abdomen: soft, non-tender; bowel sounds normal; no masses,  no organomegaly   Assessment:    1. Postpartum care following vaginal delivery - doing well  2. Encounter for other general counseling and advice on contraception - wants OCP's ( Breast Feeding )  3. Encounter for initial prescription of contraceptive pills Rx: - norethindrone (MICRONOR,CAMILA,ERRIN) 0.35 MG tablet; Take 1 tablet (0.35 mg total) by mouth daily.  Dispense: 1 Package; Refill: 11  4. Dysmenorrhea Rx: - ibuprofen (ADVIL,MOTRIN) 800 MG tablet; Take 1 tablet (800 mg total) by mouth every 8 (eight) hours as needed.  Dispense: 30 tablet; Refill: 5   Plan:   1.  Contraception: oral progesterone-only contraceptive 2. Micronor Rx 3. Follow up in: 6 months or as needed.     Brock BadHARLES A. HARPER MD 04-22-2018

## 2019-08-03 IMAGING — US US MFM OB FOLLOW-UP
1 series · 14 of 28 positions shown · non-contrast
Comparison: none

[Series 1: us mfm ob follow-up · 14 of 48 slices shown]
[im 2/48]
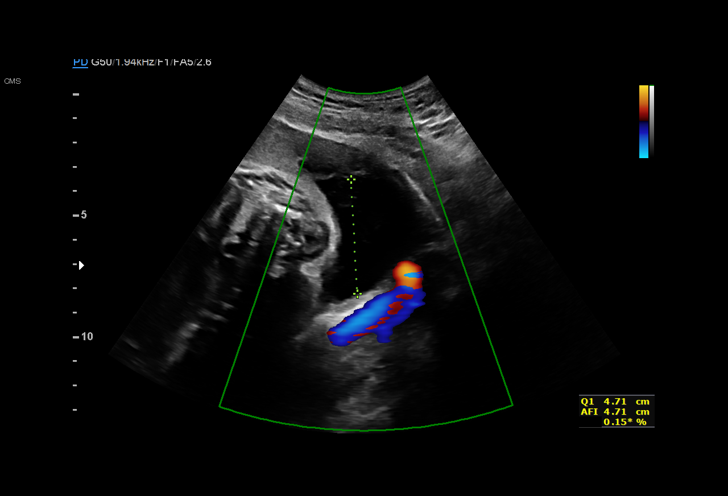
[im 6/48]
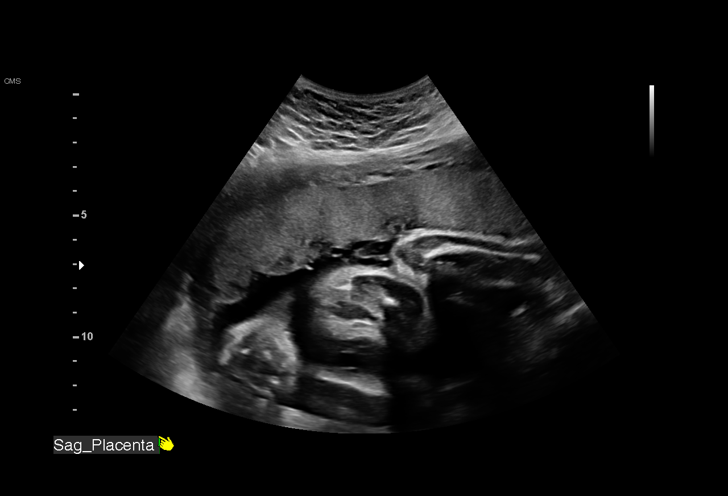
[im 9/48]
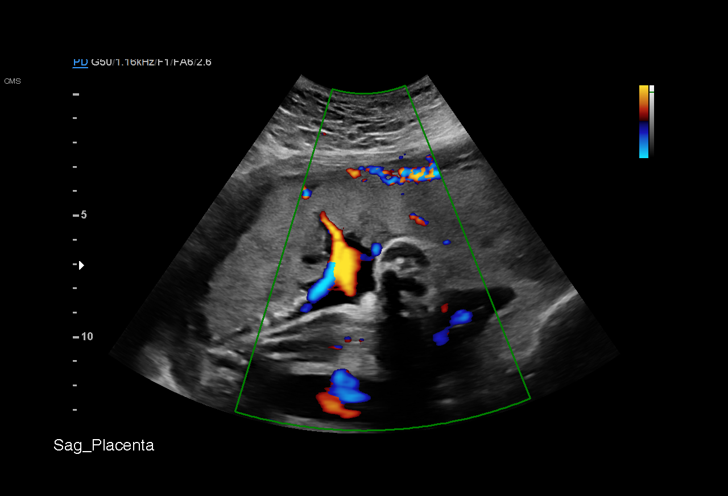
[im 13/48]
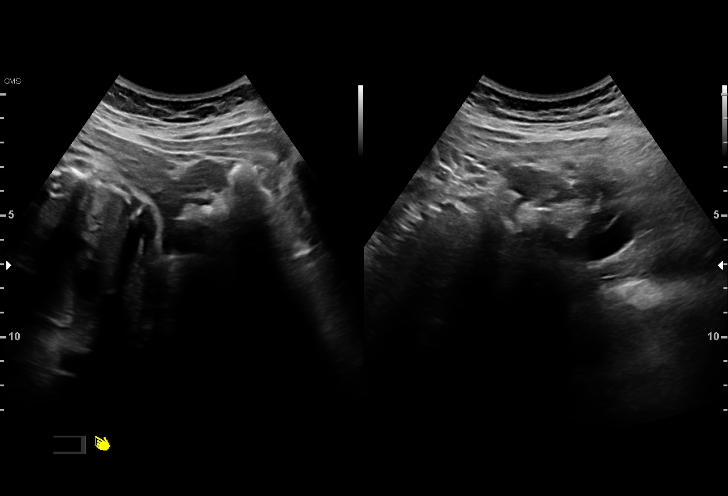
[im 16/48]
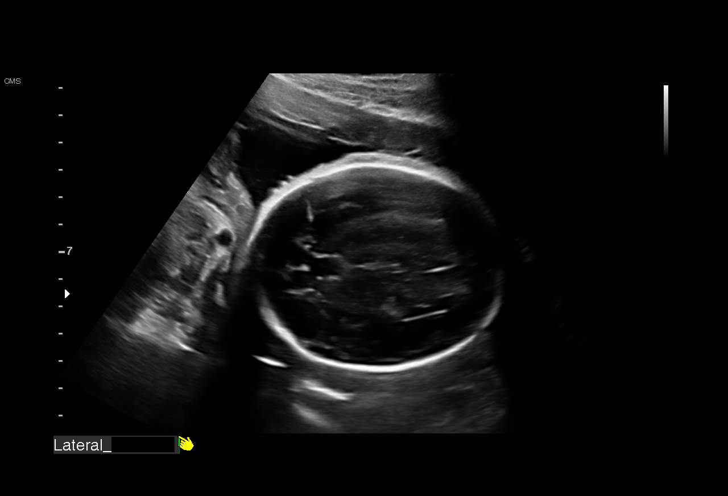
[im 20/48]
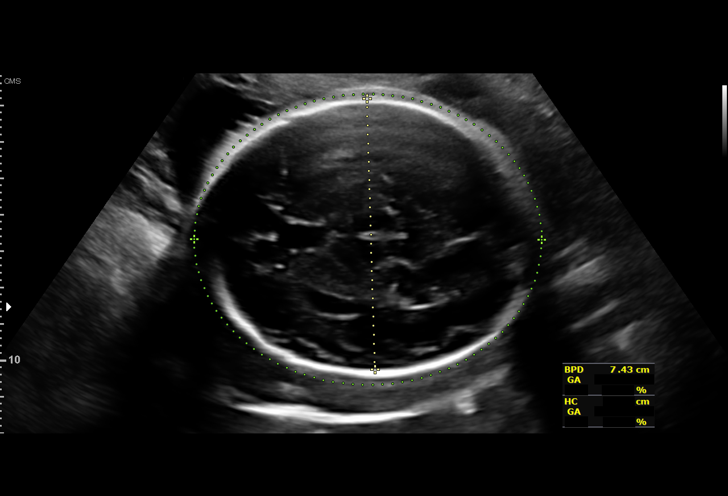
[im 23/48]
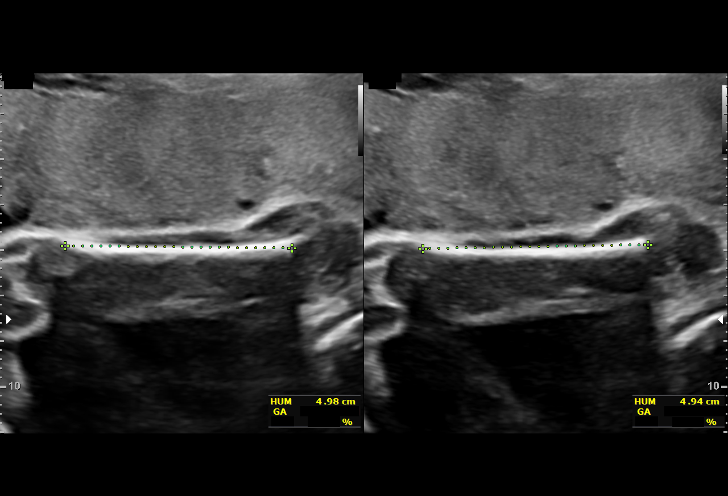
[im 27/48]
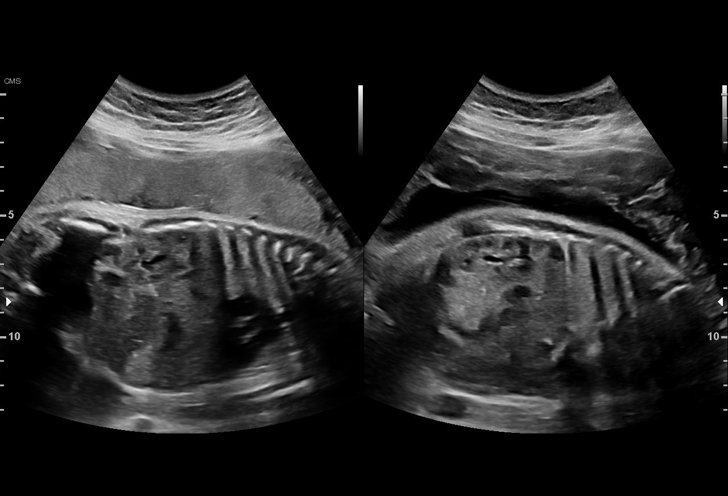
[im 30/48]
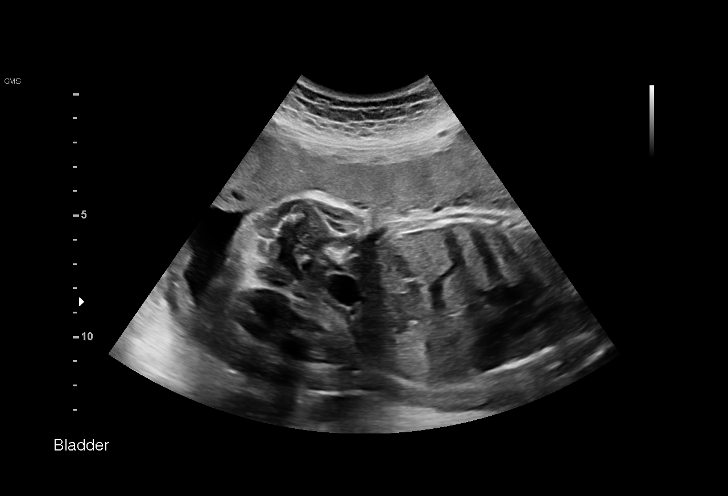
[im 34/48]
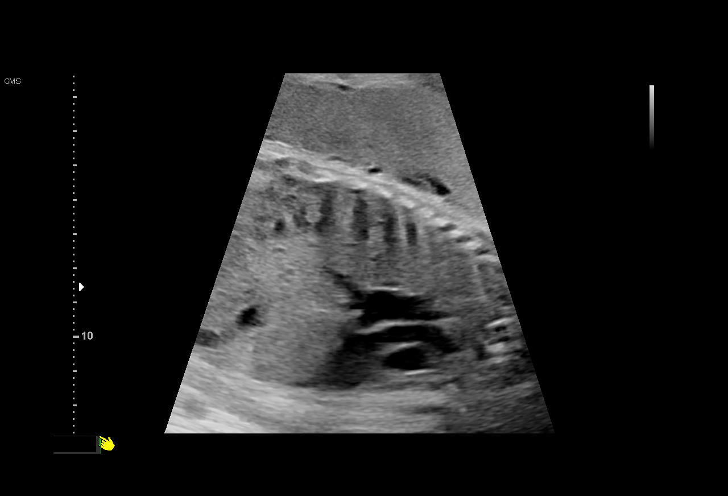
[im 37/48]
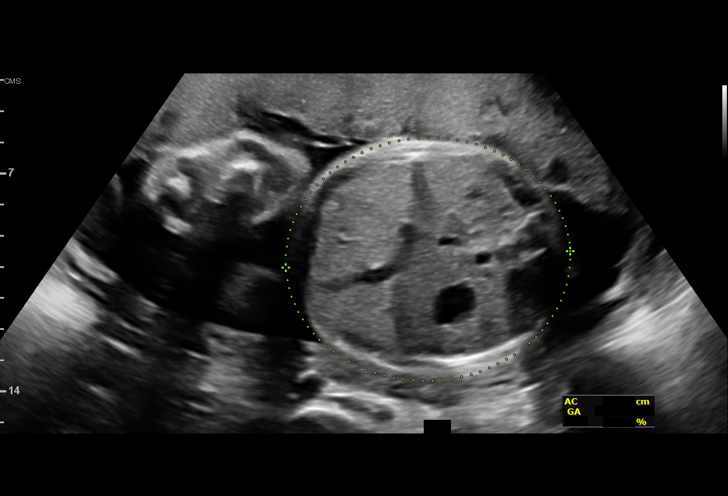
[im 41/48]
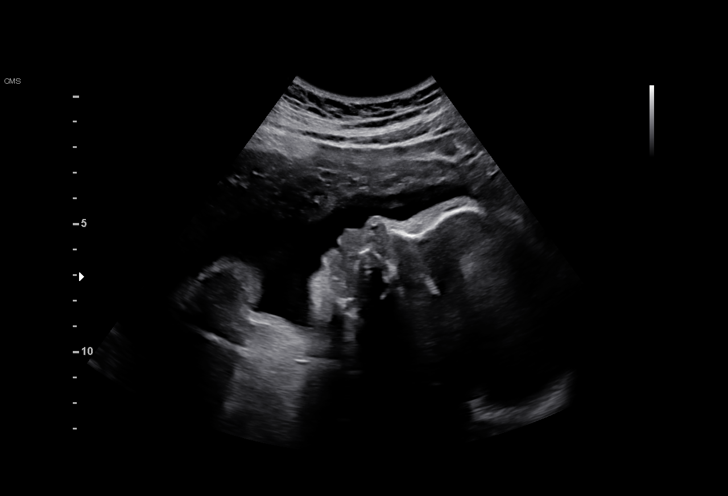
[im 44/48]
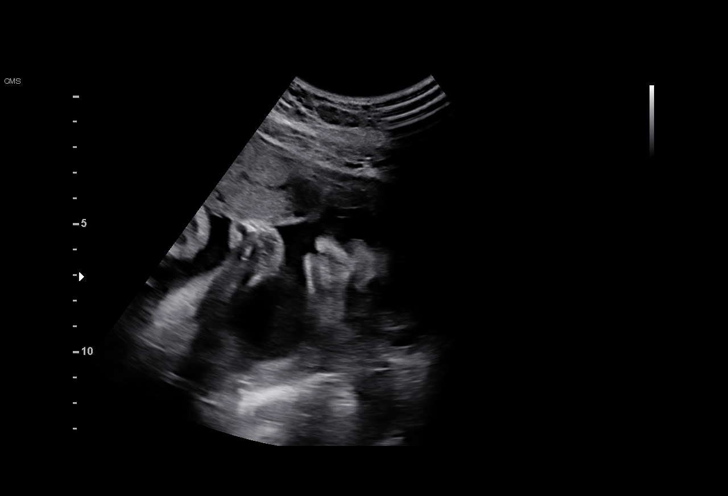
[im 48/48]
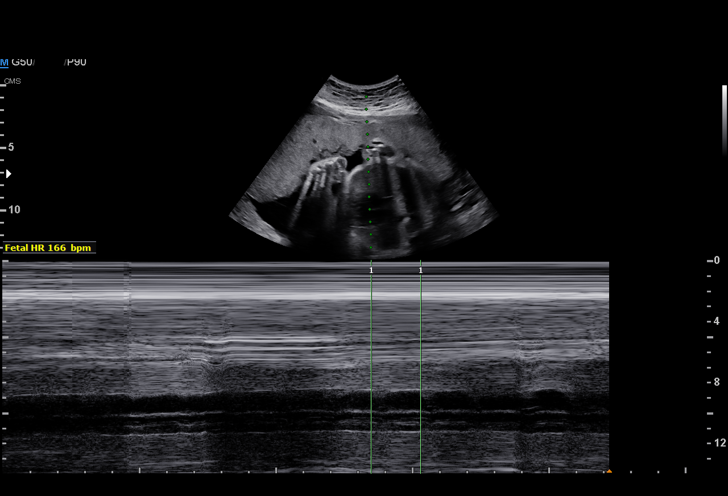

[14 of 28 positions shown; findings below may reference images not displayed]

Road [HOSPITAL]

1  RAMON L AINY              [id number]      [id number]     [id number]
Indications

29 weeks gestation of pregnancy
Encounter for other antenatal screening
follow-up
Fetal abnormality - other known or
suspected (renal pyelectasis)
OB History

Gravidity:    2          SAB:   1
Fetal Evaluation

Num Of Fetuses:     1
Fetal Heart         166
Rate(bpm):
Cardiac Activity:   Observed
Presentation:       Cephalic
Placenta:           Anterior, above cervical os
P. Cord Insertion:  Visualized

Amniotic Fluid
AFI FV:      Subjectively within normal limits

AFI Sum(cm)     %Tile       Largest Pocket(cm)
15.41           55

RUQ(cm)       RLQ(cm)       LUQ(cm)        LLQ(cm)
4.71
Biometry
BPD:      74.7  mm     G. Age:  30w 0d         50  %    CI:        74.74   %    70 - 86
FL/HC:      20.1   %    19.2 -
HC:      274.2  mm     G. Age:  29w 6d         26  %    HC/AC:      1.03        0.99 -
AC:      267.2  mm     G. Age:  30w 6d         79  %    FL/BPD:     73.6   %    71 - 87
FL:         55  mm     G. Age:  29w 0d         22  %    FL/AC:      20.6   %    20 - 24
HUM:      49.6  mm     G. Age:  29w 1d         39  %

Est. FW:    4944  gm      3 lb 5 oz     62  %
Gestational Age

LMP:           28w 3d        Date:  06/13/17                 EDD:   03/20/18
U/S Today:     30w 0d                                        EDD:   03/09/18
Best:          29w 4d     Det. By:  Early Ultrasound         EDD:   03/12/18
(08/11/17)
Anatomy

Cranium:               Appears normal         Aortic Arch:            Appears normal
Cavum:                 Appears normal         Ductal Arch:            Previously seen
Ventricles:            Appears normal         Diaphragm:              Previously seen
Choroid Plexus:        Appears normal         Stomach:                Appears normal, left
sided
Cerebellum:            Appears normal         Abdomen:                Appears normal
Posterior Fossa:       Appears normal         Abdominal Wall:         Previously seen
Nuchal Fold:           Previously seen        Cord Vessels:           Previously seen
Face:                  Appears normal         Kidneys:                Appear normal
(orbits and profile)
Lips:                  Appears normal         Bladder:                Appears normal
Thoracic:              Appears normal         Spine:                  Previously seen
Heart:                 Previously seen        Upper Extremities:      Previously seen
RVOT:                  Appears normal         Lower Extremities:      Previously seen
LVOT:                  Appears normal

Other:  Male gender. Heels and 5th digits visualized. Nasal bone visualized.
Open hands visualized.
Cervix Uterus Adnexa

Cervix
Not visualized (advanced GA >96wks)

Uterus
No abnormality visualized.

Left Ovary
Within normal limits.

Right Ovary
Not visualized.

Cul De Sac:   No free fluid seen.

Adnexa:       No abnormality visualized.
Impression

Singleton intrauterine pregnancy at 29+4 weeks with UTD
here for repeat evaluation
Interval review of the anatomy shows no sonographic
markers for aneuploidy or structural anomalies. The
previously noted UTD is no longer present
All relevant fetal anatomy has been visualized
Amniotic fluid volume is normal
Estimated fetal weight shows growth in the 62nd percentile
Recommendations

Follow-up ultrasounds as clinically indicated.
